# Patient Record
Sex: Female | Born: 1985 | Race: White | Hispanic: No | Marital: Married | Smoking: Never smoker
Health system: Southern US, Community
[De-identification: ages and names within clinical notes are randomized; demographics above are authoritative.]

## PROBLEM LIST (undated history)

## (undated) ENCOUNTER — Inpatient Hospital Stay (HOSPITAL_COMMUNITY): Payer: 59

## (undated) DIAGNOSIS — Z9109 Other allergy status, other than to drugs and biological substances: Secondary | ICD-10-CM

## (undated) DIAGNOSIS — R519 Headache, unspecified: Secondary | ICD-10-CM

## (undated) DIAGNOSIS — G8929 Other chronic pain: Secondary | ICD-10-CM

## (undated) DIAGNOSIS — M542 Cervicalgia: Secondary | ICD-10-CM

## (undated) DIAGNOSIS — R51 Headache: Secondary | ICD-10-CM

---
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
## 2007-01-03 ENCOUNTER — Emergency Department (HOSPITAL_COMMUNITY): Admission: EM | Admit: 2007-01-03 | Discharge: 2007-01-03 | Payer: Self-pay | Admitting: Emergency Medicine

## 2007-01-18 ENCOUNTER — Emergency Department (HOSPITAL_COMMUNITY): Admission: EM | Admit: 2007-01-18 | Discharge: 2007-01-19 | Payer: Self-pay | Admitting: Emergency Medicine

## 2007-08-29 ENCOUNTER — Encounter: Admission: RE | Admit: 2007-08-29 | Discharge: 2007-08-29 | Payer: Self-pay | Admitting: Obstetrics and Gynecology

## 2008-05-16 ENCOUNTER — Emergency Department (HOSPITAL_COMMUNITY): Admission: EM | Admit: 2008-05-16 | Discharge: 2008-05-16 | Payer: Self-pay | Admitting: Emergency Medicine

## 2009-12-22 ENCOUNTER — Emergency Department (HOSPITAL_COMMUNITY): Admission: EM | Admit: 2009-12-22 | Discharge: 2009-12-22 | Payer: Self-pay | Admitting: Emergency Medicine

## 2011-05-25 ENCOUNTER — Ambulatory Visit (INDEPENDENT_AMBULATORY_CARE_PROVIDER_SITE_OTHER): Payer: Commercial Managed Care - PPO | Admitting: Otolaryngology

## 2011-05-25 DIAGNOSIS — R490 Dysphonia: Secondary | ICD-10-CM

## 2011-09-07 LAB — BASIC METABOLIC PANEL
CO2: 24
Chloride: 107
Creatinine, Ser: 0.75
GFR calc Af Amer: >60
Potassium: 3.7

## 2011-09-07 LAB — DIFFERENTIAL
Basophils Relative: 1
Eosinophils Absolute: 0.1
Eosinophils Relative: 2
Monocytes Absolute: 0.4
Monocytes Relative: 7
Neutrophils Relative %: 50

## 2011-09-07 LAB — CBC
HCT: 36.8
Hemoglobin: 13.1
MCHC: 35.7
MCV: 86.2
RBC: 4.27

## 2011-10-30 ENCOUNTER — Other Ambulatory Visit (HOSPITAL_COMMUNITY): Payer: Self-pay | Admitting: Pulmonary Disease

## 2011-10-30 ENCOUNTER — Ambulatory Visit (HOSPITAL_COMMUNITY)
Admission: RE | Admit: 2011-10-30 | Discharge: 2011-10-30 | Disposition: A | Discharge status: Home / Self Care | Payer: 59 | Source: Ambulatory Visit | Attending: Pulmonary Disease | Admitting: Pulmonary Disease

## 2011-10-30 DIAGNOSIS — R05 Cough: Secondary | ICD-10-CM | POA: Insufficient documentation

## 2011-10-30 DIAGNOSIS — R079 Chest pain, unspecified: Secondary | ICD-10-CM | POA: Insufficient documentation

## 2011-10-30 DIAGNOSIS — R059 Cough, unspecified: Secondary | ICD-10-CM | POA: Insufficient documentation

## 2011-11-16 ENCOUNTER — Ambulatory Visit (INDEPENDENT_AMBULATORY_CARE_PROVIDER_SITE_OTHER): Payer: 59 | Admitting: Otolaryngology

## 2012-02-29 ENCOUNTER — Other Ambulatory Visit: Payer: Self-pay | Admitting: Obstetrics and Gynecology

## 2012-02-29 DIAGNOSIS — N63 Unspecified lump in unspecified breast: Secondary | ICD-10-CM

## 2012-03-04 ENCOUNTER — Ambulatory Visit
Admission: RE | Admit: 2012-03-04 | Discharge: 2012-03-04 | Disposition: A | Discharge status: Home / Self Care | Payer: Self-pay | Source: Ambulatory Visit | Attending: Obstetrics and Gynecology | Admitting: Obstetrics and Gynecology

## 2012-03-04 DIAGNOSIS — N63 Unspecified lump in unspecified breast: Secondary | ICD-10-CM

## 2015-04-11 ENCOUNTER — Emergency Department (HOSPITAL_COMMUNITY)
Admission: EM | Admit: 2015-04-11 | Discharge: 2015-04-11 | Disposition: A | Discharge status: Home / Self Care | Payer: 59 | Attending: Emergency Medicine | Admitting: Emergency Medicine

## 2015-04-11 ENCOUNTER — Encounter (HOSPITAL_COMMUNITY): Payer: Self-pay | Admitting: Emergency Medicine

## 2015-04-11 ENCOUNTER — Emergency Department (HOSPITAL_COMMUNITY): Payer: 59

## 2015-04-11 DIAGNOSIS — Z3202 Encounter for pregnancy test, result negative: Secondary | ICD-10-CM | POA: Insufficient documentation

## 2015-04-11 DIAGNOSIS — Z8739 Personal history of other diseases of the musculoskeletal system and connective tissue: Secondary | ICD-10-CM | POA: Diagnosis not present

## 2015-04-11 DIAGNOSIS — Z8709 Personal history of other diseases of the respiratory system: Secondary | ICD-10-CM | POA: Insufficient documentation

## 2015-04-11 DIAGNOSIS — G43809 Other migraine, not intractable, without status migrainosus: Secondary | ICD-10-CM | POA: Insufficient documentation

## 2015-04-11 DIAGNOSIS — G8929 Other chronic pain: Secondary | ICD-10-CM | POA: Insufficient documentation

## 2015-04-11 DIAGNOSIS — R51 Headache: Secondary | ICD-10-CM | POA: Diagnosis present

## 2015-04-11 HISTORY — DX: Other chronic pain: G89.29

## 2015-04-11 HISTORY — DX: Headache, unspecified: R51.9

## 2015-04-11 HISTORY — DX: Cervicalgia: M54.2

## 2015-04-11 HISTORY — DX: Other allergy status, other than to drugs and biological substances: Z91.09

## 2015-04-11 HISTORY — DX: Headache: R51

## 2015-04-11 LAB — URINALYSIS, ROUTINE W REFLEX MICROSCOPIC
Bilirubin Urine: NEGATIVE
GLUCOSE, UA: NEGATIVE mg/dL
Hgb urine dipstick: NEGATIVE
KETONES UR: NEGATIVE mg/dL
NITRITE: NEGATIVE
PH: 8 (ref 5.0–8.0)
Specific Gravity, Urine: 1.015 (ref 1.005–1.030)
Urobilinogen, UA: 0.2 mg/dL (ref 0.0–1.0)

## 2015-04-11 LAB — RAPID URINE DRUG SCREEN, HOSP PERFORMED
AMPHETAMINES: NOT DETECTED
Barbiturates: NOT DETECTED
Benzodiazepines: NOT DETECTED
Cocaine: NOT DETECTED
Opiates: NOT DETECTED
Tetrahydrocannabinol: NOT DETECTED

## 2015-04-11 LAB — I-STAT CHEM 8, ED
BUN: 10 mg/dL (ref 6–20)
CREATININE: 0.6 mg/dL (ref 0.44–1.00)
Calcium, Ion: 1.22 mmol/L (ref 1.12–1.23)
Chloride: 102 mmol/L (ref 101–111)
Glucose, Bld: 99 mg/dL (ref 70–99)
HCT: 42 % (ref 36.0–46.0)
Hemoglobin: 14.3 g/dL (ref 12.0–15.0)
POTASSIUM: 3.3 mmol/L — AB (ref 3.5–5.1)
SODIUM: 140 mmol/L (ref 135–145)
TCO2: 20 mmol/L (ref 0–100)

## 2015-04-11 LAB — URINE MICROSCOPIC-ADD ON

## 2015-04-11 LAB — PROTIME-INR
INR: 1.05 (ref 0.00–1.49)
PROTHROMBIN TIME: 13.8 s (ref 11.6–15.2)

## 2015-04-11 LAB — PREGNANCY, URINE: Preg Test, Ur: NEGATIVE

## 2015-04-11 MED ORDER — METOCLOPRAMIDE HCL 10 MG PO TABS: 10.0000 mg | ORAL_TABLET | Freq: Four times a day (QID) | ORAL | Status: DC | PRN

## 2015-04-11 MED ORDER — SODIUM CHLORIDE 0.9 % IV BOLUS (SEPSIS)
1000.0000 mL | Freq: Once | INTRAVENOUS | Status: AC
  Administered 2015-04-11: 1000 mL via INTRAVENOUS

## 2015-04-11 MED ORDER — ONDANSETRON HCL 4 MG/2ML IJ SOLN
4.0000 mg | Freq: Once | INTRAMUSCULAR | Status: AC
  Administered 2015-04-11: 4 mg via INTRAVENOUS

## 2015-04-11 MED ORDER — METOCLOPRAMIDE HCL 5 MG/ML IJ SOLN
10.0000 mg | Freq: Once | INTRAMUSCULAR | Status: AC
  Administered 2015-04-11: 10 mg via INTRAVENOUS
  Filled 2015-04-11: qty 2

## 2015-04-11 MED ORDER — DIPHENHYDRAMINE HCL 25 MG PO CAPS
50.0000 mg | ORAL_CAPSULE | Freq: Once | ORAL | Status: AC
  Administered 2015-04-11: 50 mg via ORAL
  Filled 2015-04-11: qty 2

## 2015-04-11 MED ORDER — KETOROLAC TROMETHAMINE 30 MG/ML IJ SOLN
30.0000 mg | Freq: Once | INTRAMUSCULAR | Status: AC
  Administered 2015-04-11: 30 mg via INTRAVENOUS
  Filled 2015-04-11: qty 1

## 2015-04-11 MED ORDER — ONDANSETRON HCL 4 MG/2ML IJ SOLN
INTRAMUSCULAR | Status: AC
  Filled 2015-04-11: qty 2

## 2015-04-11 NOTE — ED Notes (Signed)
Pt underoing telenurology consult at this time.

## 2015-04-11 NOTE — ED Notes (Signed)
MD at bedside. Talked with teleneurology and MD, agree to discontinue this as a code stroke.

## 2015-04-11 NOTE — ED Notes (Signed)
Pt made aware that we need a urine specimen. 
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
## 2015-04-11 NOTE — ED Notes (Addendum)
Called SOC at 14:00;SOC called back to talk to Dr.McManus at 14:05; paperwork faxed ; placed in room

## 2015-04-11 NOTE — Discharge Instructions (Signed)
°Emergency Department Resource Guide °1) Find a Doctor and Pay Out of Pocket °Although you won't have to find out who is covered by your insurance plan, it is a good idea to ask around and get recommendations. You will then need to call the office and see if the doctor you have chosen will accept you as a new patient and what types of options they offer for patients who are self-pay. Some doctors offer discounts or will set up payment plans for their patients who do not have insurance, but you will need to ask so you aren't surprised when you get to your appointment. ° °2) Contact Your Local Health Department °Not all health departments have doctors that can see patients for sick visits, but many do, so it is worth a call to see if yours does. If you don't know where your local health department is, you can check in your phone book. The CDC also has a tool to help you locate your state's health department, and many state websites also have listings of all of their local health departments. ° °3) Find a Walk-in Clinic °If your illness is not likely to be very severe or complicated, you may want to try a walk in clinic. These are popping up all over the country in pharmacies, drugstores, and shopping centers. They're usually staffed by nurse practitioners or physician assistants that have been trained to treat common illnesses and complaints. They're usually fairly quick and inexpensive. However, if you have serious medical issues or chronic medical problems, these are probably not your best option. ° °No Primary Care Doctor: °- Call [facility] at  832-8000 - they can help you locate a primary care doctor that  accepts your insurance, provides certain services, etc. °- Physician Referral Service- 1-800-533-3463 ° °Chronic Pain Problems: °Organization         Address  Phone   Notes  °[facility]  (336) 297-2271 Patients need to be referred by their primary care doctor.  ° °Medication  Assistance: °Organization         Address  Phone   Notes  °[place] [facility] [address] °[place], [place] [address] (336) 641-8030 --Must be a resident of [place] °-- Must have NO insurance coverage whatsoever (no Medicaid/ Medicare, etc.) °-- The pt. MUST have a primary care doctor that directs their care regularly and follows them in the community °  °[facility]  (866) 331-1348   °[employer]  (888) 892-1162   ° °Agencies that provide inexpensive medical care: °Organization         Address  Phone   Notes  °[facility]  (336) 832-8035   °[facility]    (336) 832-7272   °[facility] [address] °[place], [place] [address] (336) 832-4777   °[facility] [address], °[place] (336) 271-4999   °[facility]    (336) 373-0678   °[facility]    (336) 272-1050   °[facility] ° [address], [place] Phone:  (336) 832-4444, Fax:  (336) 832-4440 Hours of Operation:  9 am - 6 pm, M-F.  Also accepts Medicaid/Medicare and self-pay.  °[facility] ° [address], [place] Phone: (336) 832-3150, Fax: (336) [phone]. Hours of Operation:  8:30 am - 5:30 pm, M-F.  Also accepts Medicaid and self-pay.  °[facility] [place] [address], [place] Phone: (336) 878-6027   °[facility] [address], [place], [place] (336)723-1848, Ext. 123 Mondays & Thursdays: 7-9 AM.  First 15 patients are seen on a first come, first serve basis. °  ° °Medicaid-accepting [place] Providers: ° °Organization         Address  Phone   Notes  °[facility] [address], [place] (336) 641-2100 Also accepts self-pay patients.  °[facility] [address], [place] ° (336) 856-9996   °[facility] [address], [place]  (336) 288-8857   °[facility] [address] [place] [address], [place] (336) 299-7000   °Veita Bland [address], [place]  ° (336) 373-1557 Only accepts [place] Access Medicaid patients after they have their name applied to their card.  ° °Self-Pay (no insurance) in [place]: ° °Organization         Address  Phone   Notes  °Sickle Cell Patients, [facility] [address], [place] (336) 832-1970   °[facility] [address], [place] (336) 832-4400   °[facility] ° [address], [place] (336) 992-4800   °[facility]/Dr. Osei-Bonsu ° [address] [place] [address], [place] or [address], [place] (336) [phone] Phone number for both [place] and [place] locations is the same.  °[facility] [address], [place] (336) 299-0000   °[facility] [address] [place] [address], [place] or [address] (336) 852-7530 °(336) 878-2260   °[facility] [address], [place] (336) 350-1642, phone; (336) 294-5005, fax Sees patients 1st and 3rd Saturday of every month.  Must not qualify for public or private insurance (i.e. Medicaid, Medicare, [place] Health Choice, Veterans' Benefits) • Household income should be no more than 200% of the poverty level •The clinic cannot treat you if you are pregnant or think you are pregnant • Sexually transmitted diseases are not treated at the clinic.  ° ° °Dental Care: °Organization         Address  Phone  Notes  °[place] [facility] [address], [place] (336) 641-6152 Accepts children up to age 21 who are enrolled in Medicaid or [place] Health Choice; pregnant women with a Medicaid card; and children who have applied for Medicaid or [place] Health Choice, but were declined, whose parents can pay a reduced fee at time of service.  °[place]  [facility] [place]  [address], [place] (336) 641-7733 Accepts children up to age 21 who are enrolled in Medicaid or [place] Health Choice; pregnant women with a Medicaid card; and children who have applied for Medicaid or [place] Health Choice, but were declined, whose parents can pay a reduced fee at time of service.  °[facility] ° [address], [place] (336) 641-4533 Patients are seen by appointment only. Walk-ins are not accepted. [facility] will see patients 18 years of age and older. °Monday - Tuesday (8am-5pm) °Most Wednesdays (8:30-5pm) °$30 per visit, cash only  °[facility] ° [address], [place] (336) 641-4533 Patients are seen by appointment only. Walk-ins are not accepted. [facility] will see patients 18 years of age and older. °One   Wednesday Evening (Monthly: Volunteer Based).  $30 per visit, cash only  °[facility]  (919) 537-3737 for adults; Children under age 4, call Graduate Pediatric Dentistry at (919) [phone]. Children aged 4-14, please call (919) [phone] to request a pediatric application. ° Dental services are provided in all areas of dental care including fillings, crowns and bridges, complete and partial dentures, implants, gum treatment, root canals, and extractions. Preventive care is also provided. Treatment is provided to both adults and children. °Patients are selected via a lottery and there is often a waiting list. °  °[facility] [address], °[place] ° (336) 763-8833 [email or web address] °  °[facility] [address], [place], [place] (336)723-1848, Ext. 123 Second and Fourth Thursday of each month, opens at 6:30 AM; Clinic ends at 9 AM.  Patients are seen on a first-come first-served basis, and a limited number are seen during each clinic.  ° °[facility] ° [address], [place], [place] (336) 723-7904    Eligibility Requirements °You must have lived in [place], [place], or [place] counties for at least the last three months. °  You cannot be eligible for state or federal sponsored healthcare insurance, including Veterans Administration, Medicaid, or Medicare. °  You generally cannot be eligible for healthcare insurance through your employer.  °  How to apply: °Eligibility screenings are held every Tuesday and Wednesday afternoon from 1:00 pm until 4:00 pm. You do not need an appointment for the interview!  °[facility] [address], [place], [place] 336-631-2330   °[facility] [facility]  336-342-8273   °[facility]  336-703-3100   °[facility]  336-570-6415   ° °Behavioral Health Resources in the Community: °Intensive Outpatient Programs °Organization         Address  Phone  Notes  °[facility] [address], [place], [place] 336-878-6098   °[facility] [address], [place], [place] 336-832-9800   °[facility] [address], [place], [place] ° 336-882-2125   °[facility] [address],  °[place], [place] 1-800-853-5163 or 336-641-4981   °Substance Abuse Resources °Organization         Address  Phone  Notes  °[facility]  336-882-2125   °[facility]  336-784-9470   °[facility]  336-285-9073   °[facility]  336-845-3988   °Residential & Outpatient Substance Abuse Program  1-800-659-3381   °Psychological Services °Organization         Address  Phone  Notes  °[facility]  336- 832-9600   °[facility]  336- 378-7881   °[facility] [address], [place] 1-800-853-5163 or 336-641-4981   ° °Mobile Crisis Teams °Organization         Address  Phone  Notes  °[facility], Mobile Crisis Care Unit  1-877-626-1772   °Assertive °[facility] ° [address]  [place], [place] 336-834-9664   °Sharon DeEsch [address] °[place] 336-554-5454   ° °Self-Help/Support Groups °Organization         Address  Phone             Notes  °[facility]  - variety of support groups  336- 373-1402 Call for more information  °Narcotics Anonymous (NA), [facility] [address], °[place]  2 meetings at this location  ° °  Residential Treatment Programs °Organization         Address  Phone  Notes  °[facility] [address],    °[place]  1-866-801-8205   °[facility] ° [address], [place], [place] 704-293-8524   °[facility] [address], [place] 336-845-3988 Admissions: 8am-3pm M-F  °[facility] [address]    °[place], [place] 336-841-1104   °[facility] [address], [place], [place] 336-379-7146   °[facility] [address]  °[place], [place] 336-285-9073   °[facility] - Intensive Outpatient [address], [place], [place] 336-852-3033   °[facility] [address]  °[place], [place] 1-877-615-2722 or 336-784-9470   °[facility] ([facility] [address] [place], [place] 336-227-7417 Accepts Medicaid  °[facility] [address]  °[place] 1-800-659-3381 Substance Abuse/Addiction Treatment  ° °[place] Behavioral Health Resources °Organization         Address  Phone  Notes  °[facility]  (888) 581-9988   °Julie Brannon, PhD [address] [place], [place]   (336) 349-5553 or (336) 951-0000   °[facility]   [address] °[place], [place] (336) 349-4454   °[facility] [address], [place], [place] (336) 342-8316 Insurance/Medicaid/sponsorship through [facility]  °[facility] [address]                                    [place], [place] (336) 342-8316 Therapy/tele-psych/case    °[facility] [address]  ° [place], [place] (336) 349-2233    °Dr. Arfeen  (336) 349-4544   °[facility] [place]  [employer] [place] [facility] 1) [address], [place] °2) [address], [place] °3)  [address], [place] (336) 349-3220 °(336) 342-7768 ° °(336) 342-8140   °[place] [facility] (336) 342-1394 or (336) 342-3537 (After Hours)    ° ° °Take over the counter tylenol and ibuprofen (OR excedrin) and benadryl, as directed on packaging, with the prescription given to you today, as needed for headache.  Call your regular medical doctor tomorrow to schedule a follow up appointment within the next 3  days.  Return to the Emergency Department immediately sooner if worsening.  ° °

## 2015-04-11 NOTE — ED Notes (Signed)
Patient c/o sudden onset headache, dizziness, blurred vision, numbness in hands bilaterally, and confusion that started approx 11am. Per patient checked blood pressure 140/100 which patient states is highly unusal for her.

## 2015-04-12 LAB — I-STAT TROPONIN, ED: Troponin i, poc: 0 ng/mL (ref 0.00–0.08)

## 2015-12-14 IMAGING — CT CT HEAD W/O CM
5 of 6 series · 15 of 47 positions shown, 16 images · non-contrast
Comparison: None.

CLINICAL DATA: MID FOREHEAD HEADACHE, BILAT HAND NUMBNESS, BILAT
NECK STIFFNESS/TIGHT SINCE 11AM TODAY

EXAM:
CT HEAD WITHOUT CONTRAST
CT CERVICAL SPINE WITHOUT CONTRAST
TECHNIQUE: Multidetector CT imaging of the head and cervical spine was
performed following the standard protocol without intravenous
contrast. Multiplanar CT image reconstructions of the cervical spine
were also generated.

[Series 2: headseq 4.8 h37s · axial · 0.47mm/px · z∈[+279,+327]mm · 2 of 30 slices shown]
[im 10/30  brain]
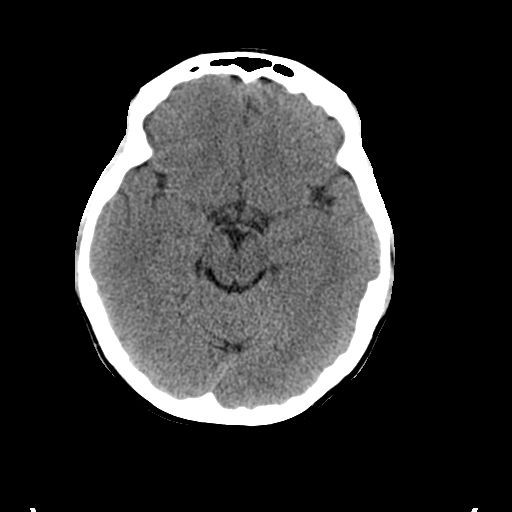
[im 20/30  brain]
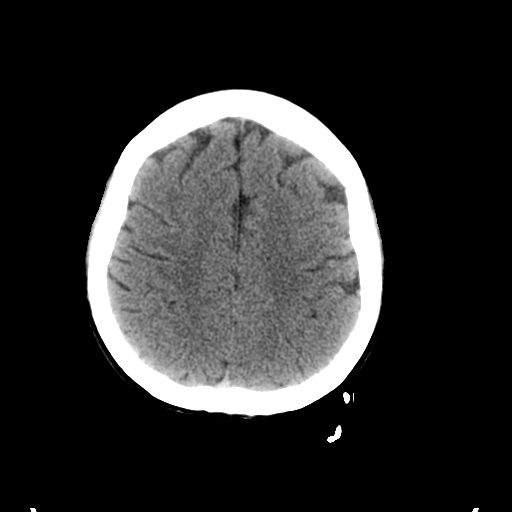

[Series 3: headseq 2.4 h60s · axial · 0.47mm/px · z∈[+261,+348]mm · 4 of 60 slices shown, 5 images]
[im 12/60  brain]
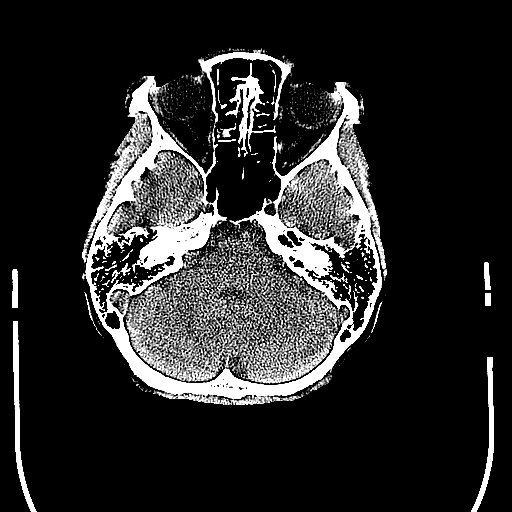
[im 12/60  bone]
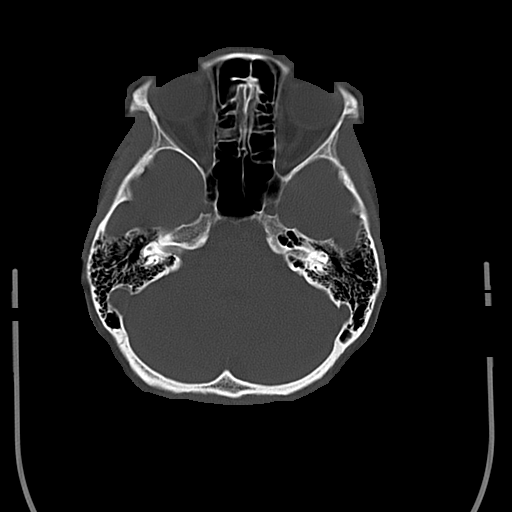
[im 24/60  brain]
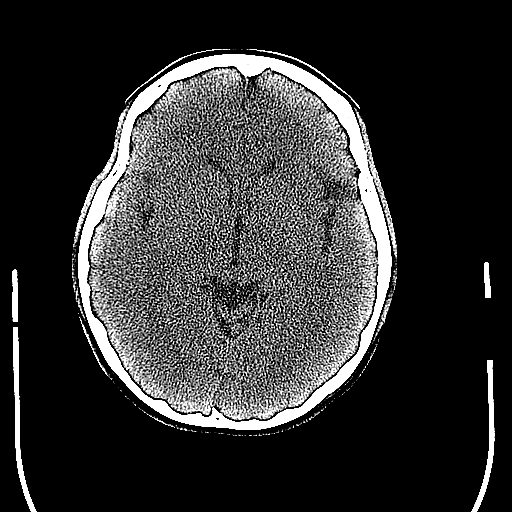
[im 36/60  brain]
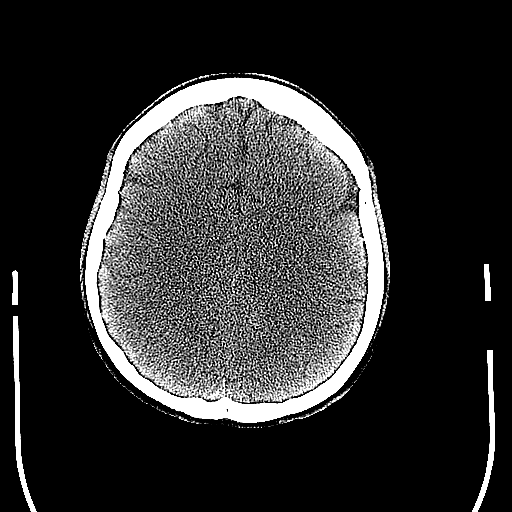
[im 48/60  brain]
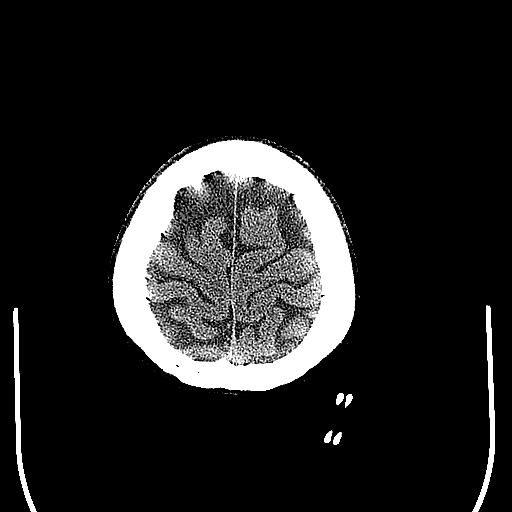

[Series 7: sagittal bone 2.0 · sagittal · 0.29mm/px · 3 of 60 slices shown]
[im 20/60  brain]
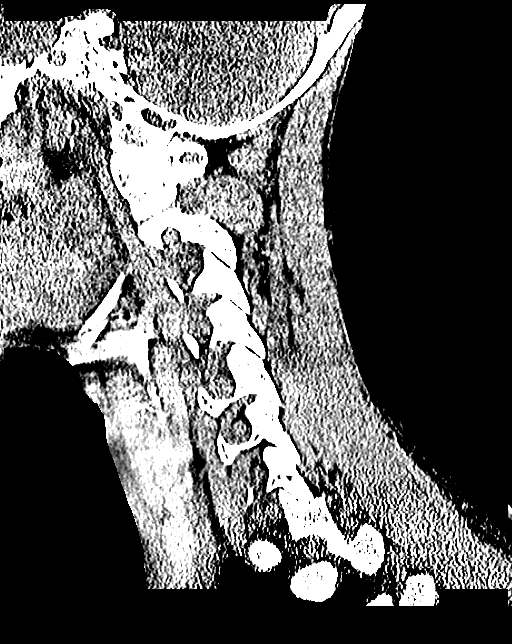
[im 30/60  brain]
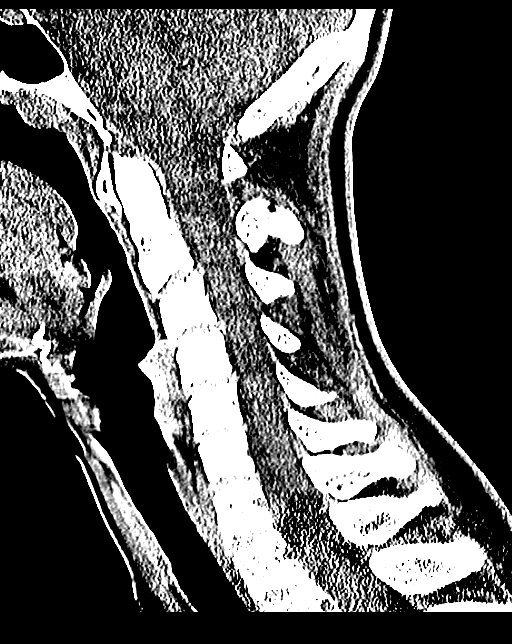
[im 40/60  brain]
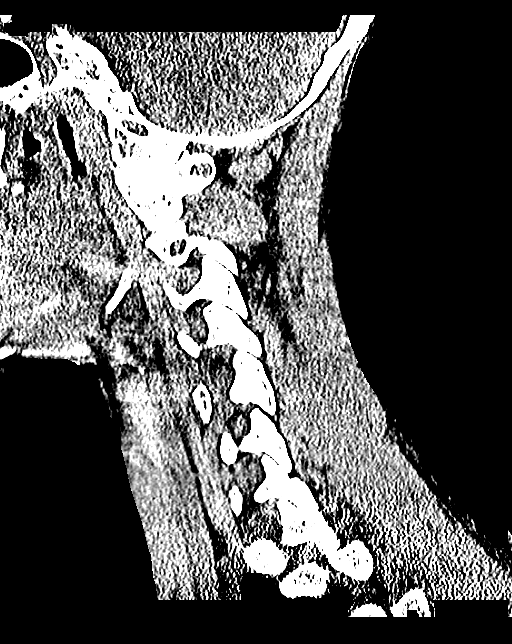

[Series 8: coronal bone 2.0 · coronal · 0.21mm/px · 3 of 54 slices shown]
[im 18/54  brain]
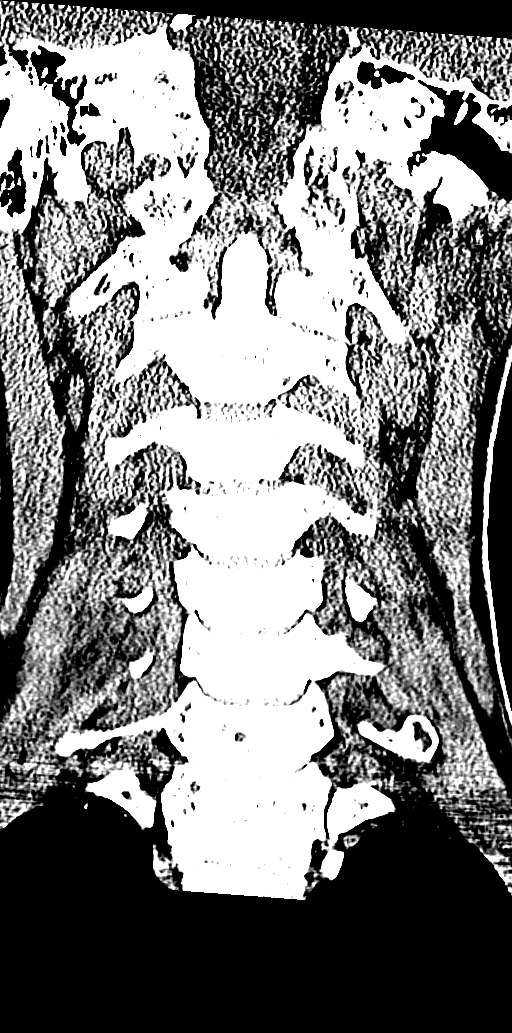
[im 24/54  brain]
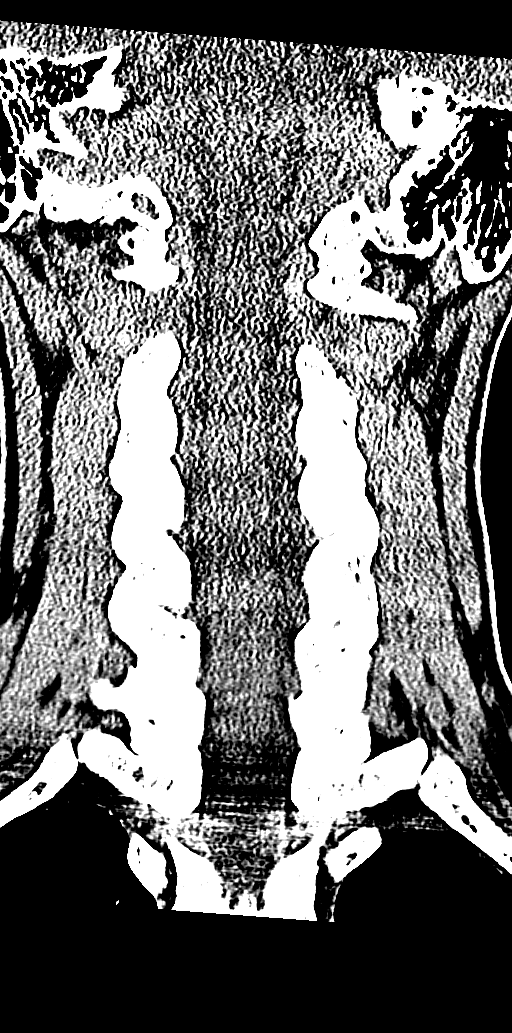
[im 30/54  brain]
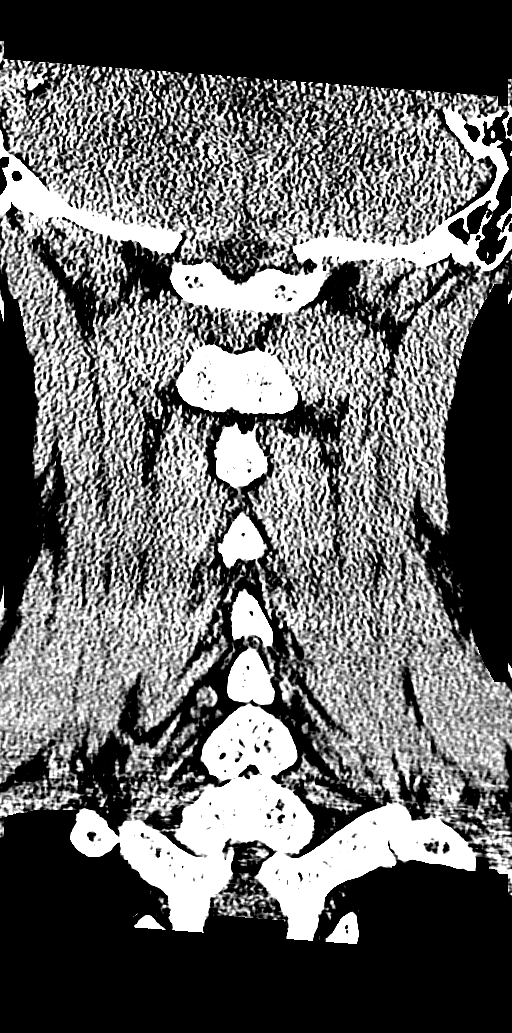

[Series 9: axial bone 2.0 · axial · 0.22mm/px · z∈[+68,+109]mm · 3 of 110 slices shown]
[im 11/110  bone]
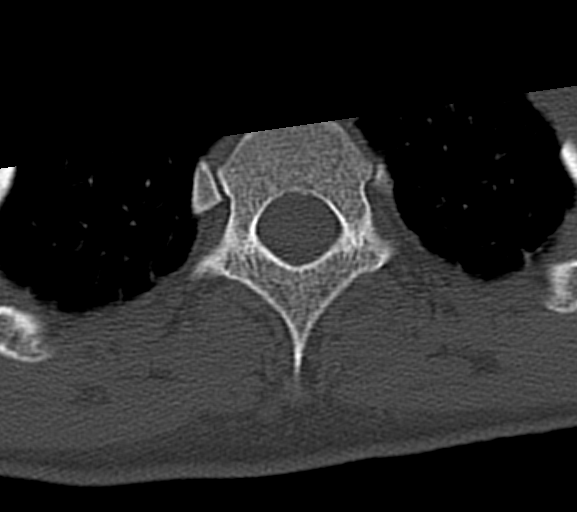
[im 22/110  bone]
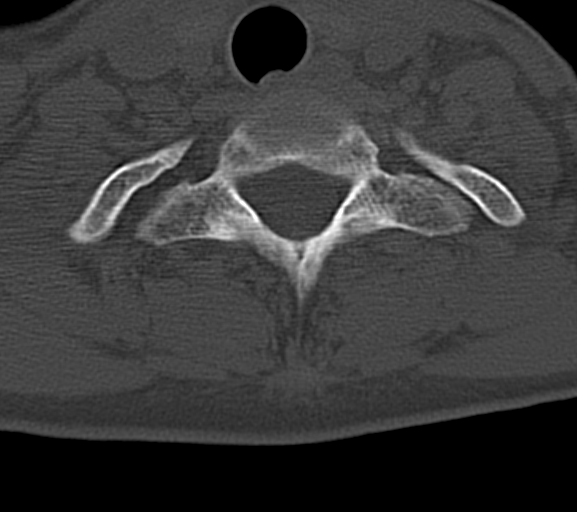
[im 33/110  bone]
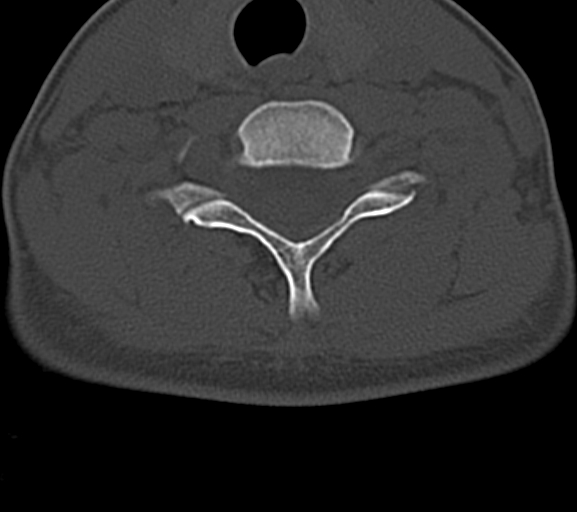

[15 of 47 positions shown; findings below may reference images not displayed]

FINDINGS: CT HEAD FINDINGS

No mass lesion. No midline shift. No acute hemorrhage or hematoma.
No extra-axial fluid collections. No evidence of acute infarction.
There is no skull fracture.

CT CERVICAL SPINE FINDINGS

Reversed lordosis. No fracture or degenerative change. No soft
tissue abnormalities.
IMPRESSION: Negative CT of the head and cervical spine. Critical Value/emergent
results were called by telephone at the time of interpretation on
04/11/2015 at [DATE] to Dr. NAZARETH JUMPER , who verbally
acknowledged these results.

## 2019-03-06 ENCOUNTER — Telehealth: Payer: 59 | Admitting: Physician Assistant

## 2019-03-06 DIAGNOSIS — Z209 Contact with and (suspected) exposure to unspecified communicable disease: Secondary | ICD-10-CM

## 2019-03-06 DIAGNOSIS — J069 Acute upper respiratory infection, unspecified: Secondary | ICD-10-CM

## 2019-03-06 NOTE — Progress Notes (Signed)
I have spent 5 minutes in review of e-visit questionnaire, review and updating patient chart, medical decision making and response to patient.   Wanita Derenzo Cody Shaunta Oncale, PA-C    

## 2019-03-06 NOTE — Progress Notes (Signed)
Message sent to patient for further information. Awaiting response.   Piedad Climes, PA-C

## 2019-03-06 NOTE — Progress Notes (Signed)
E-Visit for [employer] Virus Screening  Based on what you have told me there is a possibility of exposure to coronavirus due to the people you have come around but this could also be another one of many viruses going around. Since symptoms are mild I would watch them very closely. They are not doing routine testing currently for mild symptoms and people without exposure to a symptomatic person as it does not change management. Keep hydrated and you can utilize the OTC medications approved by your OB. I would keep at home. Since we are not testing via e-visit currently (no drive-through tent at the hospitals now) I would recommend that you stay home. The rule is if this were a mild case of coronavirus you would need to be quarantined for at least 7 days from start of symptoms and having no fever for 72 hours (without medicine) before returning to work.   I would recommend you call your OB again to let him know you did an e-visit and were told it is a possibility but symptoms are mild to see if they want to order the testing for you and to give you further input for monitoring your little one.   I do feel you will be fine based on what you've told me so far but if you note any acute worsening of symptoms, I would want you to go to the ER.   Coronavirus disease 2019 (COVID-19) is a respiratory illness that can spread from person to person. The virus that causes COVID-19 is a new virus that was first identified in the country of [place] but is now found in multiple other countries and has spread to the [place].  Symptoms associated with the virus are mild to severe fever, cough, and shortness of breath. There is currently no vaccine to protect against COVID-19, and there is no specific antiviral treatment for the virus.   To be considered HIGH RISK for Coronavirus (COVID-19), you have to meet the following criteria:  . Traveled to [place], [place], [place], [place] or [place]; or in the [place] to [place],  [place], [place], or [place]; and have fever, cough, and shortness of breath within the last 2 weeks of travel OR  . Been in close contact with a person diagnosed with COVID-19 within the last 2 weeks and have fever, cough, and shortness of breath  . IF YOU DO NOT MEET THESE CRITERIA, YOU ARE CONSIDERED LOW RISK FOR COVID-19.   It is vitally important that if you feel that you have an infection such as this virus or any other virus that you stay home and away from places where you may spread it to others.  You should self-quarantine for 14 days if you have symptoms that could potentially be coronavirus and avoid contact with people age 49 and older.   You may also take acetaminophen (Tylenol) as needed for fever.   Reduce your risk of any infection by using the same precautions used for avoiding the common cold or flu:  Marland Kitchen Wash your hands often with soap and warm water for at least 20 seconds.  If soap and water are not readily available, use an alcohol-based hand sanitizer with at least 60% alcohol.  . If coughing or sneezing, cover your mouth and nose by coughing or sneezing into the elbow areas of your shirt or coat, into a tissue or into your sleeve (not your hands). . Avoid shaking hands with others and consider head nods  or verbal greetings only. . Avoid touching your eyes, nose, or mouth with unwashed hands.  . Avoid close contact with people who are sick. . Avoid places or events with large numbers of people in one location, like concerts or sporting events. . Carefully consider travel plans you have or are making. . If you are planning any travel outside or inside the [place], visit the CDC's Travelers' Health webpage for the latest health notices. . If you have some symptoms but not all symptoms, continue to monitor at home and seek medical attention if your symptoms worsen. . If you are having a medical emergency, call 911.  HOME CARE . Only take medications as instructed by  your medical team. . Drink plenty of fluids and get plenty of rest. . A steam or ultrasonic humidifier can help if you have congestion.   GET HELP RIGHT AWAY IF: . You develop worsening fever. . You become short of breath . You cough up blood. . Your symptoms become more severe MAKE SURE YOU   Understand these instructions.  Will watch your condition.  Will get help right away if you are not doing well or get worse.  Your e-visit answers were reviewed by a board certified advanced clinical practitioner to complete your personal care plan.  Depending on the condition, your plan could have included both over the counter or prescription medications.  If there is a problem please reply once you have received a response from your provider. Your safety is important to [place].  If you have drug allergies check your prescription carefully.    You can use MyChart to ask questions about today's visit, request a non-urgent call back, or ask for a work or school excuse for 24 hours related to this e-Visit. If it has been greater than 24 hours you will need to follow up with your provider, or enter a new e-Visit to address those concerns. You will get an e-mail in the next two days asking about your experience.  I hope that your e-visit has been valuable and will speed your recovery. Thank you for using e-visits.

## 2019-07-24 ENCOUNTER — Encounter (HOSPITAL_COMMUNITY): Payer: Self-pay

## 2019-07-24 ENCOUNTER — Inpatient Hospital Stay (HOSPITAL_COMMUNITY)
Admission: AD | Admit: 2019-07-24 | Discharge: 2019-07-24 | Disposition: A | Discharge status: Home / Self Care | Payer: 59 | Attending: Obstetrics and Gynecology | Admitting: Obstetrics and Gynecology

## 2019-07-24 ENCOUNTER — Other Ambulatory Visit: Payer: Self-pay

## 2019-07-24 DIAGNOSIS — Z3A30 30 weeks gestation of pregnancy: Secondary | ICD-10-CM

## 2019-07-24 DIAGNOSIS — N949 Unspecified condition associated with female genital organs and menstrual cycle: Secondary | ICD-10-CM

## 2019-07-24 DIAGNOSIS — O26893 Other specified pregnancy related conditions, third trimester: Secondary | ICD-10-CM

## 2019-07-24 DIAGNOSIS — R109 Unspecified abdominal pain: Secondary | ICD-10-CM | POA: Diagnosis present

## 2019-07-24 LAB — URINALYSIS, ROUTINE W REFLEX MICROSCOPIC
Bilirubin Urine: NEGATIVE
Glucose, UA: NEGATIVE mg/dL
Hgb urine dipstick: NEGATIVE
Ketones, ur: NEGATIVE mg/dL
Leukocytes,Ua: NEGATIVE
Nitrite: NEGATIVE
Protein, ur: NEGATIVE mg/dL
Specific Gravity, Urine: 1.009 (ref 1.005–1.030)
pH: 8 (ref 5.0–8.0)

## 2019-07-24 MED ORDER — ACETAMINOPHEN 500 MG PO TABS
1000.0000 mg | ORAL_TABLET | Freq: Once | ORAL | Status: AC
  Administered 2019-07-24: 1000 mg via ORAL
  Filled 2019-07-24: qty 2

## 2019-07-24 NOTE — Discharge Instructions (Signed)
Fetal Movement Counts °Patient Name: ________________________________________________ Patient Due Date: ____________________ °What is a fetal movement count? ° °A fetal movement count is the number of times that you feel your baby move during a certain amount of time. This may also be called a fetal kick count. A fetal movement count is recommended for every pregnant woman. You may be asked to start counting fetal movements as early as week 28 of your pregnancy. °Pay attention to when your baby is most active. You may notice your baby's sleep and wake cycles. You may also notice things that make your baby move more. You should do a fetal movement count: °· When your baby is normally most active. °· At the same time each day. °A good time to count movements is while you are resting, after having something to eat and drink. °How do I count fetal movements? °1. Find a quiet, comfortable area. Sit, or lie down on your side. °2. Write down the date, the start time and stop time, and the number of movements that you felt between those two times. Take this information with you to your health care visits. °3. For 2 hours, count kicks, flutters, swishes, rolls, and jabs. You should feel at least 10 movements during 2 hours. °4. You may stop counting after you have felt 10 movements. °5. If you do not feel 10 movements in 2 hours, have something to eat and drink. Then, keep resting and counting for 1 hour. If you feel at least 4 movements during that hour, you may stop counting. °Contact a health care provider if: °· You feel fewer than 4 movements in 2 hours. °· Your baby is not moving like he or she usually does. °Date: ____________ Start time: ____________ Stop time: ____________ Movements: ____________ °Date: ____________ Start time: ____________ Stop time: ____________ Movements: ____________ °Date: ____________ Start time: ____________ Stop time: ____________ Movements: ____________ °Date: ____________ Start time:  ____________ Stop time: ____________ Movements: ____________ °Date: ____________ Start time: ____________ Stop time: ____________ Movements: ____________ °Date: ____________ Start time: ____________ Stop time: ____________ Movements: ____________ °Date: ____________ Start time: ____________ Stop time: ____________ Movements: ____________ °Date: ____________ Start time: ____________ Stop time: ____________ Movements: ____________ °Date: ____________ Start time: ____________ Stop time: ____________ Movements: ____________ °This information is not intended to replace advice given to you by your health care provider. Make sure you discuss any questions you have with your health care provider. °Document Released: 12/27/2006 Document Revised: 12/17/2018 Document Reviewed: 01/06/2016 °Elsevier Patient Education © 2020 Elsevier Inc. °Braxton Hicks Contractions °Contractions of the uterus can occur throughout pregnancy, but they are not always a sign that you are in labor. You may have practice contractions called Braxton Hicks contractions. These false labor contractions are sometimes confused with true labor. °What are Braxton Hicks contractions? °Braxton Hicks contractions are tightening movements that occur in the muscles of the uterus before labor. Unlike true labor contractions, these contractions do not result in opening (dilation) and thinning of the cervix. Toward the end of pregnancy (32-34 weeks), Braxton Hicks contractions can happen more often and may become stronger. These contractions are sometimes difficult to tell apart from true labor because they can be very uncomfortable. You should not feel embarrassed if you go to the hospital with false labor. °Sometimes, the only way to tell if you are in true labor is for your health care provider to look for changes in the cervix. The health care provider will do a physical exam and may monitor your contractions. If you   are not in true labor, the exam should show  that your cervix is not dilating and your water has not broken. If there are no other health problems associated with your pregnancy, it is completely safe for you to be sent home with false labor. You may continue to have Braxton Hicks contractions until you go into true labor. How to tell the difference between true labor and false labor True labor  Contractions last 30-70 seconds.  Contractions become very regular.  Discomfort is usually felt in the top of the uterus, and it spreads to the lower abdomen and low back.  Contractions do not go away with walking.  Contractions usually become more intense and increase in frequency.  The cervix dilates and gets thinner. False labor  Contractions are usually shorter and not as strong as true labor contractions.  Contractions are usually irregular.  Contractions are often felt in the front of the lower abdomen and in the groin.  Contractions may go away when you walk around or change positions while lying down.  Contractions get weaker and are shorter-lasting as time goes on.  The cervix usually does not dilate or become thin. Follow these instructions at home:   Take over-the-counter and prescription medicines only as told by your health care provider.  Keep up with your usual exercises and follow other instructions from your health care provider.  Eat and drink lightly if you think you are going into labor.  If Braxton Hicks contractions are making you uncomfortable: ? Change your position from lying down or resting to walking, or change from walking to resting. ? Sit and rest in a tub of warm water. ? Drink enough fluid to keep your urine pale yellow. Dehydration may cause these contractions. ? Do slow and deep breathing several times an hour.  Keep all follow-up prenatal visits as told by your health care provider. This is important. Contact a health care provider if:  You have a fever.  You have continuous pain in  your abdomen. Get help right away if:  Your contractions become stronger, more regular, and closer together.  You have fluid leaking or gushing from your vagina.  You pass blood-tinged mucus (bloody show).  You have bleeding from your vagina.  You have low back pain that you never had before.  You feel your babys head pushing down and causing pelvic pressure.  Your baby is not moving inside you as much as it used to. Summary  Contractions that occur before labor are called Braxton Hicks contractions, false labor, or practice contractions.  Braxton Hicks contractions are usually shorter, weaker, farther apart, and less regular than true labor contractions. True labor contractions usually become progressively stronger and regular, and they become more frequent.  Manage discomfort from [facility] contractions by changing position, resting in a warm bath, drinking plenty of water, or practicing deep breathing. This information is not intended to replace advice given to you by your health care provider. Make sure you discuss any questions you have with your health care provider. Document Released: 04/12/2017 Document Revised: 11/09/2017 Document Reviewed: 04/12/2017 Elsevier Patient Education  [place]. Round Ligament Pain  The round ligament is a cord of muscle and tissue that helps support the uterus. It can become a source of pain during pregnancy if it becomes stretched or twisted as the baby grows. The pain usually begins in the second trimester (13-28 weeks) of pregnancy, and it can come and go until the baby is delivered.  It is not a serious problem, and it does not cause harm to the baby. Round ligament pain is usually a short, sharp, and pinching pain, but it can also be a dull, lingering, and aching pain. The pain is felt in the lower side of the abdomen or in the groin. It usually starts deep in the groin and moves up to the outside of the hip area. The pain may occur  when you:  Suddenly change position, such as quickly going from a sitting to standing position.  Roll over in bed.  Cough or sneeze.  Do physical activity. Follow these instructions at home:   Watch your condition for any changes.  When the pain starts, relax. Then try any of these methods to help with the pain: ? Sitting down. ? Flexing your knees up to your abdomen. ? Lying on your side with one pillow under your abdomen and another pillow between your legs. ? Sitting in a warm bath for 15-20 minutes or until the pain goes away.  Take over-the-counter and prescription medicines only as told by your health care provider.  Move slowly when you sit down or stand up.  Avoid long walks if they cause pain.  Stop or reduce your physical activities if they cause pain.  Keep all follow-up visits as told by your health care provider. This is important. Contact a health care provider if:  Your pain does not go away with treatment.  You feel pain in your back that you did not have before.  Your medicine is not helping. Get help right away if:  You have a fever or chills.  You develop uterine contractions.  You have vaginal bleeding.  You have nausea or vomiting.  You have diarrhea.  You have pain when you urinate. Summary  Round ligament pain is felt in the lower abdomen or groin. It is usually a short, sharp, and pinching pain. It can also be a dull, lingering, and aching pain.  This pain usually begins in the second trimester (13-28 weeks). It occurs because the uterus is stretching with the growing baby, and it is not harmful to the baby.  You may notice the pain when you suddenly change position, when you cough or sneeze, or during physical activity.  Relaxing, flexing your knees to your abdomen, lying on one side, or taking a warm bath may help to get rid of the pain.  Get help from your health care provider if the pain does not go away or if you have vaginal  bleeding, nausea, vomiting, diarrhea, or painful urination. This information is not intended to replace advice given to you by your health care provider. Make sure you discuss any questions you have with your health care provider. Document Released: 09/05/2008 Document Revised: 05/15/2018 Document Reviewed: 05/15/2018 Elsevier Patient Education  2020 Elsevier Inc.  
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
## 2019-07-24 NOTE — MAU Note (Signed)
Pt states that she started having cramping at 1115 and is still going on now.   Denies vaginal bleeding or LOF.   Reports +fm

## 2019-07-31 ENCOUNTER — Other Ambulatory Visit: Payer: Self-pay

## 2019-07-31 DIAGNOSIS — Z20822 Contact with and (suspected) exposure to covid-19: Secondary | ICD-10-CM

## 2019-08-02 LAB — NOVEL CORONAVIRUS, NAA: SARS-CoV-2, NAA: NOT DETECTED

## 2019-09-14 ENCOUNTER — Encounter (HOSPITAL_COMMUNITY): Payer: Self-pay

## 2019-09-14 ENCOUNTER — Inpatient Hospital Stay (HOSPITAL_COMMUNITY)
Admission: AD | Admit: 2019-09-14 | Discharge: 2019-09-16 | DRG: 787 | Disposition: A | Discharge status: Home / Self Care | Payer: 59 | Attending: Obstetrics | Admitting: Obstetrics

## 2019-09-14 ENCOUNTER — Inpatient Hospital Stay (HOSPITAL_COMMUNITY): Payer: 59 | Admitting: Certified Registered Nurse Anesthetist

## 2019-09-14 ENCOUNTER — Encounter (HOSPITAL_COMMUNITY)
Admission: AD | Disposition: A | Discharge status: Home / Self Care | Payer: Self-pay | Source: Home / Self Care | Attending: Obstetrics

## 2019-09-14 ENCOUNTER — Other Ambulatory Visit: Payer: Self-pay

## 2019-09-14 DIAGNOSIS — O9832 Other infections with a predominantly sexual mode of transmission complicating childbirth: Secondary | ICD-10-CM | POA: Diagnosis present

## 2019-09-14 DIAGNOSIS — O288 Other abnormal findings on antenatal screening of mother: Secondary | ICD-10-CM | POA: Diagnosis not present

## 2019-09-14 DIAGNOSIS — A6000 Herpesviral infection of urogenital system, unspecified: Secondary | ICD-10-CM | POA: Diagnosis present

## 2019-09-14 DIAGNOSIS — O133 Gestational [pregnancy-induced] hypertension without significant proteinuria, third trimester: Secondary | ICD-10-CM | POA: Diagnosis present

## 2019-09-14 DIAGNOSIS — O134 Gestational [pregnancy-induced] hypertension without significant proteinuria, complicating childbirth: Principal | ICD-10-CM | POA: Diagnosis present

## 2019-09-14 DIAGNOSIS — Z3A38 38 weeks gestation of pregnancy: Secondary | ICD-10-CM | POA: Diagnosis not present

## 2019-09-14 DIAGNOSIS — Z20828 Contact with and (suspected) exposure to other viral communicable diseases: Secondary | ICD-10-CM | POA: Diagnosis present

## 2019-09-14 DIAGNOSIS — O26893 Other specified pregnancy related conditions, third trimester: Secondary | ICD-10-CM | POA: Diagnosis present

## 2019-09-14 LAB — SARS CORONAVIRUS 2 BY RT PCR (HOSPITAL ORDER, PERFORMED IN ~~LOC~~ HOSPITAL LAB): SARS Coronavirus 2: NEGATIVE

## 2019-09-14 LAB — COMPREHENSIVE METABOLIC PANEL
ALT: 13 U/L (ref 0–44)
AST: 22 U/L (ref 15–41)
Albumin: 2.8 g/dL — ABNORMAL LOW (ref 3.5–5.0)
Alkaline Phosphatase: 306 U/L — ABNORMAL HIGH (ref 38–126)
Anion gap: 12 (ref 5–15)
BUN: 8 mg/dL (ref 6–20)
CO2: 18 mmol/L — ABNORMAL LOW (ref 22–32)
Calcium: 9.3 mg/dL (ref 8.9–10.3)
Chloride: 107 mmol/L (ref 98–111)
Creatinine, Ser: 0.59 mg/dL (ref 0.44–1.00)
GFR calc Af Amer: >60 mL/min (ref >60)
GFR calc non Af Amer: >60 mL/min (ref >60)
Glucose, Bld: 103 mg/dL — ABNORMAL HIGH (ref 70–99)
Potassium: 4 mmol/L (ref 3.5–5.1)
Sodium: 137 mmol/L (ref 135–145)
Total Bilirubin: 0.5 mg/dL (ref 0.3–1.2)
Total Protein: 6.8 g/dL (ref 6.5–8.1)

## 2019-09-14 LAB — URINALYSIS, ROUTINE W REFLEX MICROSCOPIC
Bilirubin Urine: NEGATIVE
Glucose, UA: NEGATIVE mg/dL
Ketones, ur: NEGATIVE mg/dL
Leukocytes,Ua: NEGATIVE
Nitrite: NEGATIVE
Protein, ur: NEGATIVE mg/dL
Specific Gravity, Urine: 1.006 (ref 1.005–1.030)
pH: 7 (ref 5.0–8.0)

## 2019-09-14 LAB — RPR: RPR Ser Ql: NONREACTIVE

## 2019-09-14 LAB — CBC
HCT: 36.5 % (ref 36.0–46.0)
Hemoglobin: 12.3 g/dL (ref 12.0–15.0)
MCH: 28.7 pg (ref 26.0–34.0)
MCHC: 33.7 g/dL (ref 30.0–36.0)
MCV: 85.3 fL (ref 80.0–100.0)
Platelets: 217 10*3/uL (ref 150–400)
RBC: 4.28 MIL/uL (ref 3.87–5.11)
RDW: 13.3 % (ref 11.5–15.5)
WBC: 11.1 10*3/uL — ABNORMAL HIGH (ref 4.0–10.5)
nRBC: 0 % (ref 0.0–0.2)

## 2019-09-14 LAB — PROTEIN / CREATININE RATIO, URINE
Creatinine, Urine: 32.99 mg/dL
Total Protein, Urine: <6 mg/dL

## 2019-09-14 SURGERY — Surgical Case
Anesthesia: Spinal

## 2019-09-14 MED ORDER — DEXAMETHASONE SODIUM PHOSPHATE 4 MG/ML IJ SOLN
INTRAMUSCULAR | Status: DC | PRN
  Administered 2019-09-14: 4 mg via INTRAVENOUS

## 2019-09-14 MED ORDER — MEPERIDINE HCL 25 MG/ML IJ SOLN: 6.2500 mg | INTRAMUSCULAR | Status: DC | PRN

## 2019-09-14 MED ORDER — OXYCODONE HCL 5 MG PO TABS: 5.0000 mg | ORAL_TABLET | ORAL | Status: DC | PRN

## 2019-09-14 MED ORDER — KETOROLAC TROMETHAMINE 30 MG/ML IJ SOLN: 30.0000 mg | Freq: Four times a day (QID) | INTRAMUSCULAR | Status: AC | PRN

## 2019-09-14 MED ORDER — PROMETHAZINE HCL 25 MG/ML IJ SOLN: 6.2500 mg | INTRAMUSCULAR | Status: DC | PRN

## 2019-09-14 MED ORDER — KETOROLAC TROMETHAMINE 30 MG/ML IJ SOLN
INTRAMUSCULAR | Status: AC
  Filled 2019-09-14: qty 1

## 2019-09-14 MED ORDER — SODIUM CHLORIDE 0.9 % IV SOLN
INTRAVENOUS | Status: DC | PRN
  Administered 2019-09-14: 05:00:00 40 [IU] via INTRAVENOUS

## 2019-09-14 MED ORDER — DEXAMETHASONE SODIUM PHOSPHATE 4 MG/ML IJ SOLN
INTRAMUSCULAR | Status: AC
  Filled 2019-09-14: qty 1

## 2019-09-14 MED ORDER — KETOROLAC TROMETHAMINE 30 MG/ML IJ SOLN
30.0000 mg | Freq: Four times a day (QID) | INTRAMUSCULAR | Status: AC | PRN
  Administered 2019-09-14 (×3): 30 mg via INTRAVENOUS
  Filled 2019-09-14 (×2): qty 1

## 2019-09-14 MED ORDER — OXYCODONE-ACETAMINOPHEN 5-325 MG PO TABS: 2.0000 | ORAL_TABLET | ORAL | Status: DC | PRN

## 2019-09-14 MED ORDER — ONDANSETRON HCL 4 MG/2ML IJ SOLN: 4.0000 mg | Freq: Four times a day (QID) | INTRAMUSCULAR | Status: DC | PRN

## 2019-09-14 MED ORDER — LACTATED RINGERS IV SOLN: 500.0000 mL | INTRAVENOUS | Status: DC | PRN

## 2019-09-14 MED ORDER — IBUPROFEN 600 MG PO TABS
600.0000 mg | ORAL_TABLET | Freq: Four times a day (QID) | ORAL | Status: DC
  Administered 2019-09-15 – 2019-09-16 (×7): 600 mg via ORAL
  Filled 2019-09-14 (×7): qty 1

## 2019-09-14 MED ORDER — NALBUPHINE SYRINGE 5 MG/0.5 ML
5.0000 mg | INJECTION | Freq: Once | INTRAMUSCULAR | Status: DC | PRN
  Filled 2019-09-14: qty 0.5

## 2019-09-14 MED ORDER — MORPHINE SULFATE (PF) 0.5 MG/ML IJ SOLN
INTRAMUSCULAR | Status: AC
  Filled 2019-09-14: qty 10

## 2019-09-14 MED ORDER — ONDANSETRON HCL 4 MG/2ML IJ SOLN: 4.0000 mg | INTRAMUSCULAR | Status: DC | PRN

## 2019-09-14 MED ORDER — SODIUM CHLORIDE 0.9% FLUSH: 3.0000 mL | INTRAVENOUS | Status: DC | PRN

## 2019-09-14 MED ORDER — SCOPOLAMINE 1 MG/3DAYS TD PT72
1.0000 | MEDICATED_PATCH | Freq: Once | TRANSDERMAL | Status: DC
  Filled 2019-09-14: qty 1

## 2019-09-14 MED ORDER — DIBUCAINE (PERIANAL) 1 % EX OINT: 1.0000 "application " | TOPICAL_OINTMENT | CUTANEOUS | Status: DC | PRN

## 2019-09-14 MED ORDER — BENZOCAINE-MENTHOL 20-0.5 % EX AERO: 1.0000 "application " | INHALATION_SPRAY | CUTANEOUS | Status: DC | PRN

## 2019-09-14 MED ORDER — OXYCODONE-ACETAMINOPHEN 5-325 MG PO TABS: 1.0000 | ORAL_TABLET | ORAL | Status: DC | PRN

## 2019-09-14 MED ORDER — HYDROMORPHONE HCL 1 MG/ML IJ SOLN: 0.2500 mg | INTRAMUSCULAR | Status: DC | PRN

## 2019-09-14 MED ORDER — CEFAZOLIN SODIUM-DEXTROSE 2-4 GM/100ML-% IV SOLN
2.0000 g | Freq: Once | INTRAVENOUS | Status: AC
  Administered 2019-09-14: 05:00:00 2 g via INTRAVENOUS

## 2019-09-14 MED ORDER — ACETAMINOPHEN 325 MG PO TABS: 650.0000 mg | ORAL_TABLET | ORAL | Status: DC | PRN

## 2019-09-14 MED ORDER — PRENATAL MULTIVITAMIN CH
1.0000 | ORAL_TABLET | Freq: Every day | ORAL | Status: DC
  Administered 2019-09-15 – 2019-09-16 (×2): 1 via ORAL
  Filled 2019-09-14 (×2): qty 1

## 2019-09-14 MED ORDER — COCONUT OIL OIL: 1.0000 "application " | TOPICAL_OIL | Status: DC | PRN

## 2019-09-14 MED ORDER — FENTANYL CITRATE (PF) 100 MCG/2ML IJ SOLN
50.0000 ug | INTRAMUSCULAR | Status: DC | PRN
  Administered 2019-09-14: 15 ug via INTRAVENOUS

## 2019-09-14 MED ORDER — PHENYLEPHRINE HCL-NACL 20-0.9 MG/250ML-% IV SOLN
INTRAVENOUS | Status: DC | PRN
  Administered 2019-09-14: 60 ug/min via INTRAVENOUS

## 2019-09-14 MED ORDER — PHENYLEPHRINE HCL-NACL 20-0.9 MG/250ML-% IV SOLN
INTRAVENOUS | Status: AC
  Filled 2019-09-14: qty 250

## 2019-09-14 MED ORDER — SIMETHICONE 80 MG PO CHEW: 80.0000 mg | CHEWABLE_TABLET | ORAL | Status: DC | PRN

## 2019-09-14 MED ORDER — TETANUS-DIPHTH-ACELL PERTUSSIS 5-2.5-18.5 LF-MCG/0.5 IM SUSP: 0.5000 mL | Freq: Once | INTRAMUSCULAR | Status: DC

## 2019-09-14 MED ORDER — OXYCODONE HCL 5 MG/5ML PO SOLN: 5.0000 mg | Freq: Once | ORAL | Status: DC | PRN

## 2019-09-14 MED ORDER — NALOXONE HCL 0.4 MG/ML IJ SOLN: 0.4000 mg | INTRAMUSCULAR | Status: DC | PRN

## 2019-09-14 MED ORDER — FENTANYL CITRATE (PF) 100 MCG/2ML IJ SOLN
INTRAMUSCULAR | Status: AC
  Filled 2019-09-14: qty 2

## 2019-09-14 MED ORDER — ONDANSETRON HCL 4 MG/2ML IJ SOLN
INTRAMUSCULAR | Status: AC
  Filled 2019-09-14: qty 2

## 2019-09-14 MED ORDER — KETOROLAC TROMETHAMINE 30 MG/ML IJ SOLN: 30.0000 mg | Freq: Once | INTRAMUSCULAR | Status: DC | PRN

## 2019-09-14 MED ORDER — BUPIVACAINE IN DEXTROSE 0.75-8.25 % IT SOLN
INTRATHECAL | Status: DC | PRN
  Administered 2019-09-14: 1.7 mL via INTRATHECAL

## 2019-09-14 MED ORDER — DIPHENHYDRAMINE HCL 25 MG PO CAPS: 25.0000 mg | ORAL_CAPSULE | ORAL | Status: DC | PRN

## 2019-09-14 MED ORDER — SENNOSIDES-DOCUSATE SODIUM 8.6-50 MG PO TABS
2.0000 | ORAL_TABLET | ORAL | Status: DC
  Administered 2019-09-14 – 2019-09-16 (×2): 2 via ORAL
  Filled 2019-09-14 (×2): qty 2

## 2019-09-14 MED ORDER — ONDANSETRON HCL 4 MG/2ML IJ SOLN
INTRAMUSCULAR | Status: DC | PRN
  Administered 2019-09-14: 4 mg via INTRAVENOUS

## 2019-09-14 MED ORDER — ACETAMINOPHEN 500 MG PO TABS
1000.0000 mg | ORAL_TABLET | Freq: Four times a day (QID) | ORAL | Status: DC
  Administered 2019-09-14 – 2019-09-16 (×9): 1000 mg via ORAL
  Filled 2019-09-14 (×9): qty 2

## 2019-09-14 MED ORDER — DIPHENHYDRAMINE HCL 25 MG PO CAPS: 25.0000 mg | ORAL_CAPSULE | Freq: Four times a day (QID) | ORAL | Status: DC | PRN

## 2019-09-14 MED ORDER — NALBUPHINE SYRINGE 5 MG/0.5 ML
5.0000 mg | INJECTION | INTRAMUSCULAR | Status: DC | PRN
  Filled 2019-09-14: qty 0.5

## 2019-09-14 MED ORDER — OXYCODONE HCL 5 MG PO TABS: 5.0000 mg | ORAL_TABLET | Freq: Once | ORAL | Status: DC | PRN

## 2019-09-14 MED ORDER — MORPHINE SULFATE (PF) 0.5 MG/ML IJ SOLN
INTRAMUSCULAR | Status: DC | PRN
  Administered 2019-09-14: .15 mg via EPIDURAL

## 2019-09-14 MED ORDER — WITCH HAZEL-GLYCERIN EX PADS: 1.0000 "application " | MEDICATED_PAD | CUTANEOUS | Status: DC | PRN

## 2019-09-14 MED ORDER — ONDANSETRON HCL 4 MG/2ML IJ SOLN: 4.0000 mg | Freq: Three times a day (TID) | INTRAMUSCULAR | Status: DC | PRN

## 2019-09-14 MED ORDER — OXYCODONE HCL 5 MG PO TABS: 10.0000 mg | ORAL_TABLET | ORAL | Status: DC | PRN

## 2019-09-14 MED ORDER — LACTATED RINGERS IV SOLN
INTRAVENOUS | Status: DC | PRN
  Administered 2019-09-14 (×2): via INTRAVENOUS

## 2019-09-14 MED ORDER — OXYTOCIN 40 UNITS IN NORMAL SALINE INFUSION - SIMPLE MED: 2.5000 [IU]/h | INTRAVENOUS | Status: DC

## 2019-09-14 MED ORDER — DIPHENHYDRAMINE HCL 50 MG/ML IJ SOLN: 12.5000 mg | INTRAMUSCULAR | Status: DC | PRN

## 2019-09-14 MED ORDER — LIDOCAINE HCL (PF) 1 % IJ SOLN: 30.0000 mL | INTRAMUSCULAR | Status: DC | PRN

## 2019-09-14 MED ORDER — NALOXONE HCL 4 MG/10ML IJ SOLN
1.0000 ug/kg/h | INTRAVENOUS | Status: DC | PRN
  Filled 2019-09-14: qty 5

## 2019-09-14 MED ORDER — LACTATED RINGERS IV BOLUS
1000.0000 mL | Freq: Once | INTRAVENOUS | Status: AC
  Administered 2019-09-14: 1000 mL via INTRAVENOUS

## 2019-09-14 MED ORDER — ONDANSETRON HCL 4 MG PO TABS: 4.0000 mg | ORAL_TABLET | ORAL | Status: DC | PRN

## 2019-09-14 MED ORDER — LACTATED RINGERS IV SOLN
INTRAVENOUS | Status: DC
  Administered 2019-09-14: 03:00:00 via INTRAVENOUS

## 2019-09-14 MED ORDER — SODIUM CHLORIDE 0.9 % IV SOLN
INTRAVENOUS | Status: DC | PRN
  Administered 2019-09-14: 05:00:00 via INTRAVENOUS

## 2019-09-14 MED ORDER — SODIUM CHLORIDE 0.9 % IR SOLN
Status: DC | PRN
  Administered 2019-09-14: 1000 mL

## 2019-09-14 MED ORDER — OXYTOCIN BOLUS FROM INFUSION: 500.0000 mL | Freq: Once | INTRAVENOUS | Status: DC

## 2019-09-14 MED ORDER — OXYTOCIN 40 UNITS IN NORMAL SALINE INFUSION - SIMPLE MED
INTRAVENOUS | Status: AC
  Filled 2019-09-14: qty 1000

## 2019-09-14 MED ORDER — SOD CITRATE-CITRIC ACID 500-334 MG/5ML PO SOLN
30.0000 mL | ORAL | Status: DC | PRN
  Administered 2019-09-14: 30 mL via ORAL
  Filled 2019-09-14: qty 30

## 2019-09-14 SURGICAL SUPPLY — 40 items
APL SKNCLS STERI-STRIP NONHPOA (GAUZE/BANDAGES/DRESSINGS) ×1
BENZOIN TINCTURE PRP APPL 2/3 (GAUZE/BANDAGES/DRESSINGS) ×3 IMPLANT
CHLORAPREP W/TINT 26ML (MISCELLANEOUS) ×3 IMPLANT
CLAMP CORD UMBIL (MISCELLANEOUS) IMPLANT
CLOSURE STERI STRIP 1/2 X4 (GAUZE/BANDAGES/DRESSINGS) ×1 IMPLANT
CLOSURE WOUND 1/2 X4 (GAUZE/BANDAGES/DRESSINGS) ×1
CLOTH BEACON ORANGE TIMEOUT ST (SAFETY) ×3 IMPLANT
DRSG OPSITE POSTOP 4X10 (GAUZE/BANDAGES/DRESSINGS) ×3 IMPLANT
ELECT REM PT RETURN 9FT ADLT (ELECTROSURGICAL) ×3
ELECTRODE REM PT RTRN 9FT ADLT (ELECTROSURGICAL) ×1 IMPLANT
EXTRACTOR VACUUM KIWI (MISCELLANEOUS) IMPLANT
GLOVE BIOGEL PI IND STRL 6.5 (GLOVE) ×1 IMPLANT
GLOVE BIOGEL PI IND STRL 7.0 (GLOVE) ×1 IMPLANT
GLOVE BIOGEL PI INDICATOR 6.5 (GLOVE) ×2
GLOVE BIOGEL PI INDICATOR 7.0 (GLOVE) ×2
GLOVE ECLIPSE 6.0 STRL STRAW (GLOVE) ×3 IMPLANT
GOWN STRL REUS W/TWL LRG LVL3 (GOWN DISPOSABLE) ×6 IMPLANT
KIT ABG SYR 3ML LUER SLIP (SYRINGE) IMPLANT
NDL HYPO 25X5/8 SAFETYGLIDE (NEEDLE) IMPLANT
NEEDLE HYPO 25X5/8 SAFETYGLIDE (NEEDLE) IMPLANT
NS IRRIG 1000ML POUR BTL (IV SOLUTION) ×3 IMPLANT
PACK C SECTION WH (CUSTOM PROCEDURE TRAY) ×3 IMPLANT
PAD OB MATERNITY 4.3X12.25 (PERSONAL CARE ITEMS) ×3 IMPLANT
PENCIL SMOKE EVAC W/HOLSTER (ELECTROSURGICAL) ×3 IMPLANT
RTRCTR C-SECT PINK 25CM LRG (MISCELLANEOUS) ×3 IMPLANT
STRIP CLOSURE SKIN 1/2X4 (GAUZE/BANDAGES/DRESSINGS) ×2 IMPLANT
SUT MNCRL 0 VIOLET CTX 36 (SUTURE) ×2 IMPLANT
SUT MNCRL AB 3-0 PS2 27 (SUTURE) ×3 IMPLANT
SUT MON AB 4-0 PS1 27 (SUTURE) ×2 IMPLANT
SUT MONOCRYL 0 CTX 36 (SUTURE) ×4
SUT PLAIN 0 NONE (SUTURE) IMPLANT
SUT PLAIN 2 0 (SUTURE) ×3
SUT PLAIN ABS 2-0 CT1 27XMFL (SUTURE) ×1 IMPLANT
SUT VIC AB 0 CTX 36 (SUTURE) ×6
SUT VIC AB 0 CTX36XBRD ANBCTRL (SUTURE) ×2 IMPLANT
SUT VIC AB 2-0 CT1 27 (SUTURE) ×3
SUT VIC AB 2-0 CT1 TAPERPNT 27 (SUTURE) ×1 IMPLANT
TOWEL OR 17X24 6PK STRL BLUE (TOWEL DISPOSABLE) ×3 IMPLANT
TRAY FOLEY W/BAG SLVR 14FR LF (SET/KITS/TRAYS/PACK) ×3 IMPLANT
WATER STERILE IRR 1000ML POUR (IV SOLUTION) ×3 IMPLANT

## 2019-09-14 NOTE — Transfer of Care (Signed)
Immediate Anesthesia Transfer of Care Note  Patient: Marissa Stevenson  Procedure(s) Performed: CESAREAN SECTION (N/A )  Patient Location: PACU  Anesthesia Type:Spinal  Level of Consciousness: awake, alert  and patient cooperative  Airway & Oxygen Therapy: Patient Spontanous Breathing  Post-op Assessment: Report given to RN and Post -op Vital signs reviewed and stable  Post vital signs: Reviewed and stable  Last Vitals:  Vitals Value Taken Time  BP 124/78 09/14/19 0550  Temp    Pulse 65 09/14/19 0558  Resp 19 09/14/19 0558  SpO2 99 % 09/14/19 0558  Vitals shown include unvalidated device data.  Last Pain:  Vitals:   09/14/19 0400  TempSrc:   PainSc: 7       Patients Stated Pain Goal: 0 (25/42/70 6237)  Complications: No apparent anesthesia complications

## 2019-09-14 NOTE — MAU Note (Signed)
Presents with cxts approx every 2-4 min apart since 2100 today. Dec FM. No vag bldg. Noticed some "leakage" prior to going to restroom.  Gilmer Mor RN

## 2019-09-14 NOTE — Op Note (Signed)
Cesarean Section Procedure Note  Pre-operative Diagnosis: 1. Intrauterine pregnancy at [redacted]w[redacted]d  2. Gestational hypertension  3.  Latent labor  4.  Fetal intolerance of labor  Post-operative Diagnosis: same as above  Surgeon: Marlow Baars, MD  Procedure: Primary low transverse cesarean section   Anesthesia: Spinal anesthesia  Estimated Blood Loss: 315 mL         Drains: Foley catheter         Specimens: placenta to patient              Complications:  None; patient tolerated the procedure well.         Disposition: PACU - hemodynamically stable.  Findings:  Poorly developed lower uterine segment. Thick meconium stained fluid.   Normal uterus, tubes and left ovary.  Right ovary not able to be visualized or palpated.  Placenta small and calcified.  Viable female infant, 2370g (5lb 3.6 oz) Apgars 8, 9.    Procedure Details   After spinal  anesthesia was found to adequate, the patient was placed in the dorsal supine position with a leftward tilt, prepped and draped in the usual sterile manner. A Pfannenstiel incision was made and carried down through the subcutaneous tissue to the fascia.  The fascia was incised in the midline and the fascial incision was extended laterally with Mayo scissors. The superior aspect of the fascial incision was grasped with two Kocher clamps, tented up and the rectus muscles dissected off sharply. The rectus was then dissected off with blunt dissection and Mayo scissors inferiorly. The rectus muscles were separated in the midline. The abdominal peritoneum was identified, tented up, entered bluntly, and the incision was extended superiorly and inferiorly with good visualization of the bladder. The Alexis retractor was deployed. The vesicouterine peritoneum was identified and well away from the site of the future hysterotomy.  There was not a well developed lower uterine segment. A scalpel was then used to make a low transverse incision on the uterus which was extended in  the cephalad-caudad direction with the bandage scissors.  The fluid was stained with thick meconium. The fetal vertex was identified, elevated out of the pelvis and brought to the hysterotomy.  The head was delivered easily followed by the shoulders and body.  After a 60 second delay per protocol, the cord was clamped and cut and the infant was passed to the waiting neonatologist.  The placenta was then delivered spontaneously, intact and appear small and calcified.  The uterus was cleared of all clot and debris.  The patient desired to keep her placenta, so it was not sent to pathology.   The hysterotomy was repaired with #0 Monocryl in running locked fashion.  A second imbricating layer of #0 Monocryl was placed.  The Alexis retractor was removed from the abdomen. The peritoneum was examined and all vessels noted to be hemostatic. The abdominal cavity was cleared of all clot and debris.  The peritoneum was closed with 2-0 vicryl in a running fashion.  The rectus muscles were then closed with 2-0 Vicryl. The fascia and rectus muscles were inspected and were hemostatic. The fascia was closed with 0 Vicryl in a running fashion. The subcutaneous layer was irrigated and all bleeders cauterized. The subcutaneous layer was closed with interrupted plain gut. The skin was closed with 3-0 monocryl in a subcuticular fashion. The incision was dressed with benzoine, steri strips and honeycomb dressing. All sponge lap and needle counts were correct x3. Patient tolerated the procedure well and recovered in  stable condition following the procedure.
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
## 2019-09-14 NOTE — Progress Notes (Signed)
Despite fluid resuscitation and position change, persistent category 2 tracing.  Fetal heart rate is in the 165-170s with recurrent late decelerations.  She is remote from delivery.  I recommend proceeding to the OR for primary cesarean section at this time.  We discussed the risks to cesarean section to include infection, bleeding, damage to surrounding structures (including but not limited to bowel, bladder, tubes, ovaries, nerves, vessels, baby), need for blood transfusion, venous thromboembolism, need for additional procedures.

## 2019-09-14 NOTE — Lactation Note (Signed)
This note was copied from a baby's chart. Lactation Consultation Note  Patient Name: Marissa Stevenson [id number] Date: 09/14/2019 Reason for consult: Initial assessment;Infant < 6lbs;Early term 37-38.6wks;Primapara Baby is 87 hours old.  Birth weight 5-3.6.  Baby has has low temps and blood sugars.  He has received glucose gel and two formula feedings.  Mom is exhausted.  Attempts made at breast but baby has not latched yet.  Reassured mom.  Late Preterm information given to mom due to small size.  Reviewed supplemental guidelines.  Symphony pump set up.  Mom has small areola and nipples so 21 flanges are a good fit.  Initiated pumping and instructed to pump every 3 hours x 15 minutes followed by hand expression..   Colostrum containers and spoons left at bedside. Mom has a Medela DEBP at home.  She is somewhat overwhelmed so teaching will need to be reinforced later.  FOB present and supportive.  Encouraged to call for assist/concerns prn.  Maternal Data Has patient been taught Hand Expression?: Yes Does the patient have breastfeeding experience prior to this delivery?: No  Feeding Feeding Type: Bottle Fed - Formula Nipple Type: Slow - flow  LATCH Score                   Interventions Interventions: DEBP  Lactation Tools Discussed/Used Pump Review: Setup, frequency, and cleaning;Milk Storage Initiated by:: [email or web address] Date initiated:: 09/14/19   Consult Status Consult Status: Follow-up Follow-up type: In-patient    Ave Filter 09/14/2019, 2:50 PM

## 2019-09-14 NOTE — Anesthesia Procedure Notes (Signed)
Spinal  Patient location during procedure: OR Start time: 09/14/2019 4:30 AM End time: 09/14/2019 4:40 AM Staffing Anesthesiologist: Pervis Hocking, DO Performed: anesthesiologist  Preanesthetic Checklist Completed: patient identified, surgical consent, pre-op evaluation, timeout performed, IV checked, risks and benefits discussed and monitors and equipment checked Spinal Block Patient position: sitting Prep: site prepped and draped and DuraPrep Patient monitoring: cardiac monitor, continuous pulse ox and blood pressure Approach: midline Location: L3-4 Injection technique: single-shot Needle Needle type: Pencan  Needle gauge: 24 G Needle length: 9 cm Assessment Sensory level: T6 Additional Notes Functioning IV was confirmed and monitors were applied. Sterile prep and drape, including hand hygiene and sterile gloves were used. The patient was positioned and the spine was prepped. The skin was anesthetized with lidocaine.  Free flow of clear CSF was obtained prior to injecting local anesthetic into the CSF.  The spinal needle aspirated freely following injection.  The needle was carefully withdrawn.  The patient tolerated the procedure well.

## 2019-09-14 NOTE — Anesthesia Preprocedure Evaluation (Signed)
Anesthesia Evaluation  Patient identified by MRN, date of birth, ID band Patient awake    Reviewed: Allergy & Precautions, NPO status , Patient's Chart, lab work & pertinent test results  Airway Mallampati: II  TM Distance: >3 FB Neck ROM: Full    Dental no notable dental hx.    Pulmonary neg pulmonary ROS,    Pulmonary exam normal breath sounds clear to auscultation       Cardiovascular hypertension, Normal cardiovascular exam Rhythm:Regular Rate:Normal     Neuro/Psych  Headaches, negative psych ROS   GI/Hepatic negative GI ROS, Neg liver ROS,   Endo/Other  negative endocrine ROS  Renal/GU negative Renal ROS  negative genitourinary   Musculoskeletal negative musculoskeletal ROS (+)   Abdominal   Peds negative pediatric ROS (+)  Hematology negative hematology ROS (+)   Anesthesia Other Findings   Reproductive/Obstetrics (+) Pregnancy                             Anesthesia Physical Anesthesia Plan  ASA: II and emergent  Anesthesia Plan: Spinal   Post-op Pain Management:  Regional for Post-op pain   Induction:   PONV Risk Score and Plan: 3 and Ondansetron, Dexamethasone and Treatment may vary due to age or medical condition  Airway Management Planned: Natural Airway and Nasal Cannula  Additional Equipment: None  Intra-op Plan:   Post-operative Plan:   Informed Consent: I have reviewed the patients History and Physical, chart, labs and discussed the procedure including the risks, benefits and alternatives for the proposed anesthesia with the patient or authorized representative who has indicated his/her understanding and acceptance.       Plan Discussed with: CRNA  Anesthesia Plan Comments:         Anesthesia Quick Evaluation

## 2019-09-14 NOTE — Lactation Note (Signed)
This note was copied from a baby's chart. Lactation Consultation Note Baby 85 hrs old. Transferred to NICU for temperature instability and low glucose. Mom resting awake. DEBP set up by RN. Encouraged pumping Q 3 hrs then hand expression afterwards. Mom has colostrum containers. States RN demonstrated hand expression.  Praised mom. Mom has been upset d/t baby transferred to NICU. Mom has LPI information paper at bedside, LC reviewed. LC reviewed supplementing, feeding time limits, covering, STS, I&O, milk storage for NICU, supply and demand. Encouraged mom to call for assistance or questions. Lactation brochure and NICU booklet given.  Patient Name: Marissa Stevenson [id number] Date: 09/14/2019 Reason for consult: Initial assessment;NICU baby;Infant < 6lbs;Primapara   Maternal Data Has patient been taught Hand Expression?: Yes Does the patient have breastfeeding experience prior to this delivery?: No  Feeding Feeding Type: Formula Nipple Type: Slow - flow  LATCH Score                   Interventions    Lactation Tools Discussed/Used WIC Program: No Pump Review: Setup, frequency, and cleaning;Milk Storage(LC reviewed milk storage for NICU baby) Initiated by:: RN Date initiated:: 09/14/19   Consult Status Consult Status: Follow-up Date: 09/15/19 Follow-up type: In-patient    Theodoro Kalata 09/14/2019, 10:54 PM
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
## 2019-09-14 NOTE — Anesthesia Postprocedure Evaluation (Signed)
Anesthesia Post Note  Patient: Marissa Stevenson  Procedure(s) Performed: CESAREAN SECTION (N/A )     Patient location during evaluation: PACU Anesthesia Type: Spinal Level of consciousness: oriented and awake and alert Pain management: pain level controlled Vital Signs Assessment: post-procedure vital signs reviewed and stable Respiratory status: spontaneous breathing and respiratory function stable Cardiovascular status: blood pressure returned to baseline and stable Postop Assessment: no headache, no backache, no apparent nausea or vomiting and spinal receding Anesthetic complications: no    Last Vitals:  Vitals:   09/14/19 0600 09/14/19 0615  BP: 116/71 (!) 126/97  Pulse: 68 65  Resp: 16 (!) 24  Temp:    SpO2: 99% 100%    Last Pain:  Vitals:   09/14/19 0615  TempSrc:   PainSc: (P) 0-No pain   Pain Goal: Patients Stated Pain Goal: 0 (09/14/19 0400)  LLE Motor Response: (P) Purposeful movement (09/14/19 0615) LLE Sensation: (P) Tingling (09/14/19 0615) RLE Motor Response: (P) Purposeful movement (09/14/19 0615) RLE Sensation: (P) Tingling (09/14/19 0615)     Epidural/Spinal Function Cutaneous sensation: (P) Tingles (09/14/19 0615), Patient able to flex knees: (P) No (09/14/19 0615), Patient able to lift hips off bed: (P) No (09/14/19 0615), Back pain beyond tenderness at insertion site: (P) No (09/14/19 0615), Progressively worsening motor and/or sensory loss: (P) No (09/14/19 0615), Bowel and/or bladder incontinence post epidural: (P) No (09/14/19 0615)  Pervis Hocking

## 2019-09-15 ENCOUNTER — Encounter (HOSPITAL_COMMUNITY): Payer: Self-pay | Admitting: *Deleted

## 2019-09-15 LAB — CBC
HCT: 29 % — ABNORMAL LOW (ref 36.0–46.0)
Hemoglobin: 9.4 g/dL — ABNORMAL LOW (ref 12.0–15.0)
MCH: 28 pg (ref 26.0–34.0)
MCHC: 32.4 g/dL (ref 30.0–36.0)
MCV: 86.3 fL (ref 80.0–100.0)
Platelets: 165 10*3/uL (ref 150–400)
RBC: 3.36 MIL/uL — ABNORMAL LOW (ref 3.87–5.11)
RDW: 13.5 % (ref 11.5–15.5)
WBC: 11 10*3/uL — ABNORMAL HIGH (ref 4.0–10.5)
nRBC: 0 % (ref 0.0–0.2)

## 2019-09-15 NOTE — Progress Notes (Signed)
  Patient is eating, ambulating, voiding.  Pain control is good.  Vitals:   09/14/19 1230 09/14/19 1700 09/14/19 2134 09/15/19 0603  BP:   119/70 130/73  Pulse: 63  65 72  Resp: 18 16 17 19   Temp: 97.9 F (36.6 C) 98.5 F (36.9 C) 97.6 F (36.4 C) 97.9 F (36.6 C)  TempSrc: Oral Oral Oral Oral  SpO2: 99% 100% 100% 100%  Weight:      Height:        lungs:   clear to auscultation cor:    RRR Abdomen:  soft, appropriate tenderness, incisions intact and without erythema or exudate ex:    no cords   Lab Results  Component Value Date   WBC 11.0 (H) 09/15/2019   HGB 9.4 (L) 09/15/2019   HCT 29.0 (L) 09/15/2019   MCV 86.3 09/15/2019   PLT 165 09/15/2019    --/--/O NEG (10/04 0115)/RI  A/P    Post operative day 2 with GHTN, not severe.  Routine post op and postpartum care.  Expect d/c  Tomorrow.   Baby O neg- no Rhogam needed.  Percocet for pain control.

## 2019-09-15 NOTE — Progress Notes (Signed)
Patient screened out for psychosocial assessment since none of the following apply: °Psychosocial stressors documented in mother or baby's chart °Gestation less than 32 weeks °Code at delivery  °Infant with anomalies °Please contact the Clinical Social Worker if specific needs arise, by MOB's request, or if MOB scores greater than 9/yes to question 10 on Edinburgh Postpartum Depression Screen. ° °Ouida Abeyta Boyd-Gilyard, MSW, LCSW °Clinical Social Work °[phone] °  °

## 2019-09-15 NOTE — Lactation Note (Signed)
This note was copied from a baby's chart. Lactation Consultation Note  Patient Name: Marissa Stevenson [id number] Date: 09/15/2019 Reason for consult: Early term 37-38.6wks;Primapara;1st time breastfeeding;NICU baby;Infant < 6lbs  P1 mother whose infant is now 58 hours old.  This is an ETI at 38+2 weeks weighing <6 lbs and in the NICU.  Baby was admitted for temperature instability and hypoglycemia.  Visited with father in NICU first.  After briefly speaking with father I went to mother's room and spent a good amount of time comforting and answering questions.  Mother is tired and has a sore back from her epidural.  She has been trying to pump every three hours and has not observed any colostrum drops yet.  Praised her efforts and set up a pumping schedule for her which includes one interval of 4 hours uninterrupted night time sleep.  Encouraged her to continue hand expression before/after feedings to help increase supply.  She had not been doing this.  She has colostrum containers and labels.  Baby had a colostrum drop today swabbed in his cheek.    Encouraged mother to pump at baby's bedside in the NICU.  Demonstrated how to remove pump parts and transport all pieces to the NICU.  Mother is interested in doing this.  Suggested she speak with her RN and begin doing STS in the NICU; explained the benefits of STS.  Let her know that, when baby is ready to begin latching, RN/LC will be available to assist.  This made mother feel more comfortable.  Asked her to call for any further questions/concerns she may have.  Mother appreciative of my visit.     Maternal Data Formula Feeding for Exclusion: No Has patient been taught Hand Expression?: Yes Does the patient have breastfeeding experience prior to this delivery?: No  Feeding Feeding Type: Formula  LATCH Score                   Interventions    Lactation Tools Discussed/Used WIC Program: No Pump Review: (Reviewed and answered  mother's questions)   Consult Status Consult Status: Follow-up Date: 09/16/19 Follow-up type: In-patient    Little Ishikawa 09/15/2019, 9:14 PM

## 2019-09-16 ENCOUNTER — Ambulatory Visit: Payer: Self-pay

## 2019-09-16 MED ORDER — IBUPROFEN 600 MG PO TABS: 600.0000 mg | ORAL_TABLET | Freq: Four times a day (QID) | ORAL | 0 refills | Status: DC | PRN

## 2019-09-16 NOTE — Lactation Note (Signed)
This note was copied from a baby's chart. Lactation Consultation Note  Patient Name: Marissa Stevenson [id number] Date: 09/16/2019 Reason for consult: Follow-up assessment  1729 - I followed up with Marissa Stevenson upon request by [facility] who saw her earlier today. Marissa Stevenson was holding baby upon entry in swaddle. I asked her if she had any questions or needed any help, and she declined. She appeared emotional upon entry upon learning about a new development regarding her son. She has been unable to hold baby skin to skin (he was swaddled in her arms) due to extenuating circumstances, but she plans to do so when it becomes possible.  I encouraged Marissa Stevenson to call lactation anytime she needs assistance. Marissa Stevenson brought her DEBP kit with her to the hospital and plans to use it. I reminded her to pump 8 times a day for 15 minutes.   She thanked me for the visit and had no further questions.   Feeding Feeding Type: Formula Nipple Type: Nfant Slow Flow (purple)  Consult Status Consult Status: Follow-up Date: 09/17/19 Follow-up type: In-patient    Marissa Stevenson 09/16/2019, 6:10 PM

## 2019-09-16 NOTE — Lactation Note (Signed)
This note was copied from a baby's chart. Lactation Consultation Note:  Infant is in NICU. Infant is 38.2 weeks. Infant is now 64 hours old.  Infant has had difficulty with maintaining blood sugar.   Mother has been pumping but reports that she has not obtained any colostrum.  Assist mother with hand expression. Expressed 2-3 ml in a colostrum vial. Mother very elated to see her milk.   Mother to be discharged today and plans to go to NICU and stay.  Discussed importance of mother getting proper rest.  Reviewed importance of consistent pumping. Advised mother to pump every 2-3 hours for 15-20 mins. Mother to pump at least 8 times in 24 hours.   Mother reports that she plans to do STS today with infant.  Advised mother to have [place] paged to infants room for feeding assessment . Infant is being formula fed now.   Discussed treatment and prevention of engorgement.   Mother was given a hard pump to use . She was fit with the #21 flange. Mother advised to hand express before and after each pumping.   Discussed transporting ebm to the hospital. Mother reports that she has a breastmilk cooler. Reviewed storage guidelines in NICU booklet.  Parents very receptive to all teaching.   Mother has a Medela pump at home. Discussed the use of the Symphony in infants room.   Mother is aware of available [place] services and community support.     Patient Name: Marissa Stevenson [id number] Date: 09/16/2019 Reason for consult: Follow-up assessment   Maternal Data Has patient been taught Hand Expression?: (P) Yes  Feeding Feeding Type: Formula Nipple Type: Nfant Slow Flow (purple)  LATCH Score                   Interventions Interventions: Hand express;Expressed milk;Comfort gels;Hand pump;DEBP  Lactation Tools Discussed/Used     Consult Status Consult Status: Follow-up(mother to phone for [facility] assistance while in the NICU)    Darla Lesches 09/16/2019, 12:56 PM

## 2019-09-16 NOTE — Discharge Summary (Signed)
Obstetric Discharge Summary Reason for Admission: onset of labor and GHTN Prenatal Procedures: Preeclampsia and ultrasound Intrapartum Procedures: cesarean: low cervical, transverse Postpartum Procedures: none Complications-Operative and Postpartum: none Hemoglobin  Date Value Ref Range Status  09/15/2019 9.4 (L) 12.0 - 15.0 g/dL Final    Comment:    REPEATED TO VERIFY   HCT  Date Value Ref Range Status  09/15/2019 29.0 (L) 36.0 - 46.0 % Final    Physical Exam:  General: alert and cooperative Lochia: appropriate Uterine Fundus: firm Incision: healing well, no significant drainage DVT Evaluation: No evidence of DVT seen on physical exam.  Discharge Diagnoses: Term Pregnancy-delivered  Discharge Information: Date: 09/16/2019 Activity: pelvic rest Diet: routine Medications: PNV and Ibuprofen Condition: stable Instructions: refer to practice specific booklet Discharge to: home Follow-up Information    Jerelyn Charles, MD Follow up in 4 week(s).   Specialty: Obstetrics Contact information: [place] [address] [phone]           Newborn Data: Live born female  Birth Weight: 5 lb 3.6 oz (2370 g) APGAR: 8, 9  Newborn Delivery   Birth date/time: 09/14/2019 04:59:00 Delivery type: C-Section, Low Transverse Trial of labor: Yes C-section categorization: Primary      Baby to remain in NICU at discharge of mother.  Allyn Kenner 09/16/2019, 9:32 AM

## 2019-09-17 LAB — BPAM RBC
Blood Product Expiration Date: 202010312359
Blood Product Expiration Date: 202011082359
ISSUE DATE / TIME: 202010032212
ISSUE DATE / TIME: 202010032212
Unit Type and Rh: 9500
Unit Type and Rh: 9500

## 2019-09-17 LAB — TYPE AND SCREEN
ABO/RH(D): O NEG
Antibody Screen: POSITIVE
Unit division: 0
Unit division: 0

## 2019-09-20 ENCOUNTER — Ambulatory Visit: Payer: Self-pay

## 2019-09-20 NOTE — Lactation Note (Signed)
This note was copied from a baby's chart. Lactation Consultation Note  Patient Name: Boy Karia Pyper [id number] Date: 09/20/2019   Baby 28 days old in NICU being discharged today.  < 6 lbs. Mother started latching baby yesterday and feels good about latch.  LC did not view latch since baby had recently finished feeding. Mother requested lactation due to concerns regarding a drop in her supply. Reviewed her current pumping frequency and mother is pumping 7 times per day. Recommend pumping at least 8 times per day. Mother has been told about the hand on pumping video but has not watched it yet. FOB pulled video up on his phone for both of them to watch. Encouraged STS, hands on pumping and be sure to pump between 12a-5a. Discussed feeding plan and mother will check with Ped MD regarding supplemental formula.   Recommend limiting bf session to 20-25 min and if baby is too sleepy to latch, give breastmilk in bottle first.  Reviewed paced feeding. Mother asked about taking supplement.  Recommend waiting at least 2 weeks before trying fenugreek.        Maternal Data    Feeding Feeding Type: Breast Fed Nipple Type: Slow - flow  LATCH Score                   Interventions    Lactation Tools Discussed/Used     Consult Status      Carlye Grippe 09/20/2019, 1:14 PM

## 2020-09-01 ENCOUNTER — Other Ambulatory Visit: Payer: Self-pay

## 2020-09-01 ENCOUNTER — Encounter: Payer: Self-pay | Admitting: Emergency Medicine

## 2020-09-01 ENCOUNTER — Ambulatory Visit
Admission: EM | Admit: 2020-09-01 | Discharge: 2020-09-01 | Disposition: A | Discharge status: Home / Self Care | Payer: BLUE CROSS/BLUE SHIELD | Attending: Emergency Medicine | Admitting: Emergency Medicine

## 2020-09-01 DIAGNOSIS — R0981 Nasal congestion: Secondary | ICD-10-CM

## 2020-09-01 DIAGNOSIS — J3489 Other specified disorders of nose and nasal sinuses: Secondary | ICD-10-CM | POA: Diagnosis not present

## 2020-09-01 DIAGNOSIS — J0100 Acute maxillary sinusitis, unspecified: Secondary | ICD-10-CM

## 2020-09-01 MED ORDER — AMOXICILLIN-POT CLAVULANATE 875-125 MG PO TABS: 1.0000 | ORAL_TABLET | Freq: Two times a day (BID) | ORAL | 0 refills | Status: AC

## 2020-09-01 MED ORDER — DEXAMETHASONE SODIUM PHOSPHATE 10 MG/ML IJ SOLN
10.0000 mg | Freq: Once | INTRAMUSCULAR | Status: AC
  Administered 2020-09-01: 10 mg via INTRAMUSCULAR

## 2020-09-01 NOTE — ED Triage Notes (Signed)
Headache, sinus tenderness and swelling to LT side of face since this morning. Has tried allergy medication and 600mg  of ibuprofen with no relief.

## 2020-09-01 NOTE — Discharge Instructions (Signed)
Declines COVID test Get plenty of rest and push fluids Steroid shot given in office Augmentin prescribed for possible sinus infection Use OTC medications like ibuprofen or tylenol as needed fever or pain Call or go to the ED if you have any new or worsening symptoms such as fever, cough, shortness of breath, chest tightness, chest pain, turning blue, changes in mental status, etc..Marland Kitchen
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
## 2021-02-19 ENCOUNTER — Ambulatory Visit
Admission: RE | Admit: 2021-02-19 | Discharge: 2021-02-19 | Disposition: A | Discharge status: Home / Self Care | Payer: BC Managed Care – PPO | Source: Ambulatory Visit | Attending: Emergency Medicine | Admitting: Emergency Medicine

## 2021-02-19 ENCOUNTER — Other Ambulatory Visit: Payer: Self-pay

## 2021-02-19 VITALS — BP 130/77 | HR 82 | Temp 97.3°F | Resp 16

## 2021-02-19 DIAGNOSIS — M542 Cervicalgia: Secondary | ICD-10-CM

## 2021-02-19 MED ORDER — DEXAMETHASONE SODIUM PHOSPHATE 10 MG/ML IJ SOLN
10.0000 mg | Freq: Once | INTRAMUSCULAR | Status: AC
  Administered 2021-02-19: 10 mg via INTRAMUSCULAR

## 2021-02-19 MED ORDER — CYCLOBENZAPRINE HCL 10 MG PO TABS: 10.0000 mg | ORAL_TABLET | Freq: Every day | ORAL | 0 refills | Status: DC

## 2021-02-19 MED ORDER — PREDNISONE 10 MG PO TABS: 20.0000 mg | ORAL_TABLET | Freq: Every day | ORAL | 0 refills | Status: DC
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
## 2021-02-19 NOTE — ED Triage Notes (Signed)
Pain to back of neck when moving head up and down.  Pain started after trying to lift her child last night.

## 2021-02-19 NOTE — Discharge Instructions (Addendum)
Rest, ice and heat as needed Ensure adequate ROM as tolerated. Continue ibuprofen as needed for pain relief Prescribed prednisone/take as directed Prescribed flexeril  for muscle spasm.  Do not drive or operate heavy machinery while taking this medication Return here or go to ER if you have any new or worsening symptoms such as numbness/tingling of the inner thighs, loss of bladder or bowel control, headache/blurry vision, nausea/vomiting, confusion/altered mental status, dizziness, weakness, passing out, imbalance, etc..Marland Kitchen

## 2022-02-08 DIAGNOSIS — F4322 Adjustment disorder with anxiety: Secondary | ICD-10-CM | POA: Diagnosis not present

## 2022-03-01 DIAGNOSIS — Z0000 Encounter for general adult medical examination without abnormal findings: Secondary | ICD-10-CM | POA: Diagnosis not present

## 2022-03-01 DIAGNOSIS — Z13220 Encounter for screening for lipoid disorders: Secondary | ICD-10-CM | POA: Diagnosis not present

## 2022-03-01 DIAGNOSIS — Z1231 Encounter for screening mammogram for malignant neoplasm of breast: Secondary | ICD-10-CM | POA: Diagnosis not present

## 2022-03-01 DIAGNOSIS — R0789 Other chest pain: Secondary | ICD-10-CM | POA: Diagnosis not present

## 2022-03-02 DIAGNOSIS — R079 Chest pain, unspecified: Secondary | ICD-10-CM | POA: Diagnosis not present

## 2022-04-13 DIAGNOSIS — F4322 Adjustment disorder with anxiety: Secondary | ICD-10-CM | POA: Diagnosis not present

## 2022-04-26 DIAGNOSIS — Z01419 Encounter for gynecological examination (general) (routine) without abnormal findings: Secondary | ICD-10-CM | POA: Diagnosis not present

## 2022-04-26 DIAGNOSIS — Z113 Encounter for screening for infections with a predominantly sexual mode of transmission: Secondary | ICD-10-CM | POA: Diagnosis not present

## 2022-04-26 DIAGNOSIS — Z3202 Encounter for pregnancy test, result negative: Secondary | ICD-10-CM | POA: Diagnosis not present

## 2022-04-26 DIAGNOSIS — N925 Other specified irregular menstruation: Secondary | ICD-10-CM | POA: Diagnosis not present

## 2022-04-26 DIAGNOSIS — Z6822 Body mass index (BMI) 22.0-22.9, adult: Secondary | ICD-10-CM | POA: Diagnosis not present

## 2022-05-02 DIAGNOSIS — R112 Nausea with vomiting, unspecified: Secondary | ICD-10-CM | POA: Diagnosis not present

## 2022-05-02 DIAGNOSIS — O26841 Uterine size-date discrepancy, first trimester: Secondary | ICD-10-CM | POA: Diagnosis not present

## 2022-05-02 DIAGNOSIS — Z3480 Encounter for supervision of other normal pregnancy, unspecified trimester: Secondary | ICD-10-CM | POA: Diagnosis not present

## 2022-05-02 DIAGNOSIS — Z3A01 Less than 8 weeks gestation of pregnancy: Secondary | ICD-10-CM | POA: Diagnosis not present

## 2022-05-10 DIAGNOSIS — Z369 Encounter for antenatal screening, unspecified: Secondary | ICD-10-CM | POA: Diagnosis not present

## 2022-05-17 DIAGNOSIS — Z3A08 8 weeks gestation of pregnancy: Secondary | ICD-10-CM | POA: Diagnosis not present

## 2022-05-17 DIAGNOSIS — O3680X1 Pregnancy with inconclusive fetal viability, fetus 1: Secondary | ICD-10-CM | POA: Diagnosis not present

## 2022-05-18 DIAGNOSIS — F4322 Adjustment disorder with anxiety: Secondary | ICD-10-CM | POA: Diagnosis not present

## 2022-05-31 DIAGNOSIS — O3680X1 Pregnancy with inconclusive fetal viability, fetus 1: Secondary | ICD-10-CM | POA: Diagnosis not present

## 2022-05-31 DIAGNOSIS — Z3480 Encounter for supervision of other normal pregnancy, unspecified trimester: Secondary | ICD-10-CM | POA: Diagnosis not present

## 2022-05-31 DIAGNOSIS — Z3A10 10 weeks gestation of pregnancy: Secondary | ICD-10-CM | POA: Diagnosis not present

## 2022-05-31 LAB — OB RESULTS CONSOLE HIV ANTIBODY (ROUTINE TESTING): HIV: NONREACTIVE

## 2022-05-31 LAB — OB RESULTS CONSOLE RPR: RPR: NONREACTIVE

## 2022-05-31 LAB — OB RESULTS CONSOLE RUBELLA ANTIBODY, IGM: Rubella: IMMUNE

## 2022-05-31 LAB — OB RESULTS CONSOLE HEPATITIS B SURFACE ANTIGEN: Hepatitis B Surface Ag: NEGATIVE

## 2022-06-15 DIAGNOSIS — F4322 Adjustment disorder with anxiety: Secondary | ICD-10-CM | POA: Diagnosis not present

## 2022-06-22 DIAGNOSIS — Z369 Encounter for antenatal screening, unspecified: Secondary | ICD-10-CM | POA: Diagnosis not present

## 2022-06-26 ENCOUNTER — Ambulatory Visit
Admission: EM | Admit: 2022-06-26 | Discharge: 2022-06-26 | Disposition: A | Discharge status: Home / Self Care | Payer: BC Managed Care – PPO | Attending: Nurse Practitioner | Admitting: Nurse Practitioner

## 2022-06-26 DIAGNOSIS — J029 Acute pharyngitis, unspecified: Secondary | ICD-10-CM | POA: Diagnosis not present

## 2022-06-26 LAB — POCT RAPID STREP A (OFFICE): Rapid Strep A Screen: NEGATIVE

## 2022-06-26 MED ORDER — AMOXICILLIN 500 MG PO CAPS: 500.0000 mg | ORAL_CAPSULE | Freq: Two times a day (BID) | ORAL | 0 refills | Status: AC

## 2022-06-26 NOTE — ED Triage Notes (Signed)
Pt presents with c/o sore throat that first began last Wednesday, has had low grade fever

## 2022-06-26 NOTE — Discharge Instructions (Signed)
-   Rapid strep throat test today is negative; I would still like to treat you for strep throat based on your symptoms.  Please start the amoxicillin 500 mg take it twice daily for 10 days -Use nasal saline rinses to help with congestion, warm liquids with lemon and honey to help with sore throat/cough -Change toothbrush after starting treatment -Seek care if symptoms persist or worsen despite treatment
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
## 2022-07-14 DIAGNOSIS — Z3482 Encounter for supervision of other normal pregnancy, second trimester: Secondary | ICD-10-CM | POA: Diagnosis not present

## 2022-07-14 DIAGNOSIS — Z369 Encounter for antenatal screening, unspecified: Secondary | ICD-10-CM | POA: Diagnosis not present

## 2022-07-18 DIAGNOSIS — F4322 Adjustment disorder with anxiety: Secondary | ICD-10-CM | POA: Diagnosis not present

## 2022-08-04 DIAGNOSIS — Z3A19 19 weeks gestation of pregnancy: Secondary | ICD-10-CM | POA: Diagnosis not present

## 2022-08-04 DIAGNOSIS — Z363 Encounter for antenatal screening for malformations: Secondary | ICD-10-CM | POA: Diagnosis not present

## 2022-08-30 DIAGNOSIS — O26842 Uterine size-date discrepancy, second trimester: Secondary | ICD-10-CM | POA: Diagnosis not present

## 2022-08-30 DIAGNOSIS — Z3A23 23 weeks gestation of pregnancy: Secondary | ICD-10-CM | POA: Diagnosis not present

## 2022-09-21 DIAGNOSIS — Z369 Encounter for antenatal screening, unspecified: Secondary | ICD-10-CM | POA: Diagnosis not present

## 2022-10-02 DIAGNOSIS — Z369 Encounter for antenatal screening, unspecified: Secondary | ICD-10-CM | POA: Diagnosis not present

## 2022-10-02 DIAGNOSIS — Z3480 Encounter for supervision of other normal pregnancy, unspecified trimester: Secondary | ICD-10-CM | POA: Diagnosis not present

## 2022-10-04 DIAGNOSIS — F4322 Adjustment disorder with anxiety: Secondary | ICD-10-CM | POA: Diagnosis not present

## 2022-10-05 DIAGNOSIS — O360990 Maternal care for other rhesus isoimmunization, unspecified trimester, not applicable or unspecified: Secondary | ICD-10-CM | POA: Diagnosis not present

## 2022-10-18 DIAGNOSIS — Z3A30 30 weeks gestation of pregnancy: Secondary | ICD-10-CM | POA: Diagnosis not present

## 2022-10-18 DIAGNOSIS — O26843 Uterine size-date discrepancy, third trimester: Secondary | ICD-10-CM | POA: Diagnosis not present

## 2022-11-06 DIAGNOSIS — Z369 Encounter for antenatal screening, unspecified: Secondary | ICD-10-CM | POA: Diagnosis not present

## 2022-11-14 DIAGNOSIS — F4322 Adjustment disorder with anxiety: Secondary | ICD-10-CM | POA: Diagnosis not present

## 2022-11-20 DIAGNOSIS — Z87898 Personal history of other specified conditions: Secondary | ICD-10-CM | POA: Diagnosis not present

## 2022-11-20 DIAGNOSIS — Z3A34 34 weeks gestation of pregnancy: Secondary | ICD-10-CM | POA: Diagnosis not present

## 2022-12-01 ENCOUNTER — Ambulatory Visit (INDEPENDENT_AMBULATORY_CARE_PROVIDER_SITE_OTHER): Payer: BC Managed Care – PPO | Admitting: Cardiology

## 2022-12-01 ENCOUNTER — Encounter: Payer: Self-pay | Admitting: Cardiology

## 2022-12-01 VITALS — BP 154/97 | HR 65 | Ht 67.0 in | Wt 184.1 lb

## 2022-12-01 DIAGNOSIS — O10013 Pre-existing essential hypertension complicating pregnancy, third trimester: Secondary | ICD-10-CM

## 2022-12-01 DIAGNOSIS — O133 Gestational [pregnancy-induced] hypertension without significant proteinuria, third trimester: Secondary | ICD-10-CM | POA: Diagnosis not present

## 2022-12-01 DIAGNOSIS — R0602 Shortness of breath: Secondary | ICD-10-CM

## 2022-12-01 DIAGNOSIS — Z3A36 36 weeks gestation of pregnancy: Secondary | ICD-10-CM

## 2022-12-01 DIAGNOSIS — O09523 Supervision of elderly multigravida, third trimester: Secondary | ICD-10-CM

## 2022-12-01 DIAGNOSIS — R079 Chest pain, unspecified: Secondary | ICD-10-CM | POA: Diagnosis not present

## 2022-12-01 MED ORDER — NIFEDIPINE ER OSMOTIC RELEASE 30 MG PO TB24: 30.0000 mg | ORAL_TABLET | Freq: Every day | ORAL | 3 refills | Status: DC
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
## 2022-12-01 NOTE — Patient Instructions (Signed)
Medication Instructions:  Your physician has recommended you make the following change in your medication:  START: Nifedipine 30mg  daily.  *If you need a refill on your cardiac medications before your next appointment, please call your pharmacy*   Lab Work: Your physician recommends that you have the following labs drawn today: BMET, Magnesium, and Lft's  If you have labs (blood work) drawn today and your tests are completely normal, you will receive your results only by: MyChart Message (if you have MyChart) OR A paper copy in the mail If you have any lab test that is abnormal or we need to change your treatment, we will call you to review the results.   Testing/Procedures: Your physician has requested that you have an echocardiogram. Echocardiography is a painless test that uses sound waves to create images of your heart. It provides your doctor with information about the size and shape of your heart and how well your heart's chambers and valves are working. This procedure takes approximately one hour. There are no restrictions for this procedure. Please do NOT wear cologne, perfume, aftershave, or lotions (deodorant is allowed). Please arrive 15 minutes prior to your appointment time.    Follow-Up: At [facility], you and your health needs are our priority.  As part of our continuing mission to provide you with exceptional heart care, we have created designated Provider Care Teams.  These Care Teams include your primary Cardiologist (physician) and Advanced Practice Providers (APPs -  Physician Assistants and Nurse Practitioners) who all work together to provide you with the care you need, when you need it.  We recommend signing up for the patient portal called "MyChart".  Sign up information is provided on this After Visit Summary.  MyChart is used to connect with patients for Virtual Visits (Telemedicine).  Patients are able to view lab/test results, encounter notes, upcoming  appointments, etc.  Non-urgent messages can be sent to your provider as well.   To learn more about what you can do with MyChart, go to [facility].    Your next appointment:   12/27 double book at [facility]   The format for your next appointment:   In Person  Provider:   1/28, DO  [address] [place], [place]

## 2022-12-02 ENCOUNTER — Inpatient Hospital Stay (HOSPITAL_COMMUNITY): Payer: BC Managed Care – PPO | Admitting: Anesthesiology

## 2022-12-02 ENCOUNTER — Encounter (HOSPITAL_COMMUNITY): Payer: Self-pay | Admitting: Obstetrics and Gynecology

## 2022-12-02 ENCOUNTER — Encounter (HOSPITAL_COMMUNITY)
Admission: AD | Disposition: A | Discharge status: Home / Self Care | Payer: Self-pay | Source: Home / Self Care | Attending: Obstetrics and Gynecology

## 2022-12-02 ENCOUNTER — Other Ambulatory Visit: Payer: Self-pay

## 2022-12-02 ENCOUNTER — Inpatient Hospital Stay (HOSPITAL_COMMUNITY)
Admission: AD | Admit: 2022-12-02 | Discharge: 2022-12-04 | DRG: 787 | Disposition: A | Discharge status: Home / Self Care | Payer: BC Managed Care – PPO | Attending: Obstetrics and Gynecology | Admitting: Obstetrics and Gynecology

## 2022-12-02 DIAGNOSIS — A6000 Herpesviral infection of urogenital system, unspecified: Secondary | ICD-10-CM | POA: Diagnosis not present

## 2022-12-02 DIAGNOSIS — O34211 Maternal care for low transverse scar from previous cesarean delivery: Secondary | ICD-10-CM | POA: Diagnosis not present

## 2022-12-02 DIAGNOSIS — Z2882 Immunization not carried out because of caregiver refusal: Secondary | ICD-10-CM | POA: Diagnosis not present

## 2022-12-02 DIAGNOSIS — D62 Acute posthemorrhagic anemia: Secondary | ICD-10-CM | POA: Diagnosis not present

## 2022-12-02 DIAGNOSIS — R519 Headache, unspecified: Secondary | ICD-10-CM

## 2022-12-02 DIAGNOSIS — O1414 Severe pre-eclampsia complicating childbirth: Secondary | ICD-10-CM | POA: Diagnosis not present

## 2022-12-02 DIAGNOSIS — O26893 Other specified pregnancy related conditions, third trimester: Secondary | ICD-10-CM | POA: Diagnosis not present

## 2022-12-02 DIAGNOSIS — Z6791 Unspecified blood type, Rh negative: Secondary | ICD-10-CM

## 2022-12-02 DIAGNOSIS — O43813 Placental infarction, third trimester: Secondary | ICD-10-CM | POA: Diagnosis not present

## 2022-12-02 DIAGNOSIS — Z051 Observation and evaluation of newborn for suspected infectious condition ruled out: Secondary | ICD-10-CM | POA: Diagnosis not present

## 2022-12-02 DIAGNOSIS — O9081 Anemia of the puerperium: Secondary | ICD-10-CM | POA: Diagnosis not present

## 2022-12-02 DIAGNOSIS — O9832 Other infections with a predominantly sexual mode of transmission complicating childbirth: Secondary | ICD-10-CM | POA: Diagnosis not present

## 2022-12-02 DIAGNOSIS — O1493 Unspecified pre-eclampsia, third trimester: Secondary | ICD-10-CM

## 2022-12-02 DIAGNOSIS — O1410 Severe pre-eclampsia, unspecified trimester: Secondary | ICD-10-CM | POA: Diagnosis present

## 2022-12-02 DIAGNOSIS — Z3A36 36 weeks gestation of pregnancy: Secondary | ICD-10-CM

## 2022-12-02 DIAGNOSIS — R030 Elevated blood-pressure reading, without diagnosis of hypertension: Secondary | ICD-10-CM | POA: Diagnosis not present

## 2022-12-02 LAB — COMPREHENSIVE METABOLIC PANEL
ALT: 11 U/L (ref 0–44)
AST: 21 U/L (ref 15–41)
Albumin: 2.8 g/dL — ABNORMAL LOW (ref 3.5–5.0)
Alkaline Phosphatase: 200 U/L — ABNORMAL HIGH (ref 38–126)
Anion gap: 9 (ref 5–15)
BUN: 7 mg/dL (ref 6–20)
CO2: 18 mmol/L — ABNORMAL LOW (ref 22–32)
Calcium: 8.6 mg/dL — ABNORMAL LOW (ref 8.9–10.3)
Chloride: 108 mmol/L (ref 98–111)
Creatinine, Ser: 0.59 mg/dL (ref 0.44–1.00)
GFR, Estimated: >60 mL/min (ref >60)
Glucose, Bld: 83 mg/dL (ref 70–99)
Potassium: 3.8 mmol/L (ref 3.5–5.1)
Sodium: 135 mmol/L (ref 135–145)
Total Bilirubin: 0.7 mg/dL (ref 0.3–1.2)
Total Protein: 6.7 g/dL (ref 6.5–8.1)

## 2022-12-02 LAB — CBC WITH DIFFERENTIAL/PLATELET
Abs Immature Granulocytes: 0.04 10*3/uL (ref 0.00–0.07)
Basophils Absolute: 0.1 10*3/uL (ref 0.0–0.1)
Basophils Relative: 1 %
Eosinophils Absolute: 0 10*3/uL (ref 0.0–0.5)
Eosinophils Relative: 1 %
HCT: 34.6 % — ABNORMAL LOW (ref 36.0–46.0)
Hemoglobin: 11 g/dL — ABNORMAL LOW (ref 12.0–15.0)
Immature Granulocytes: 1 %
Lymphocytes Relative: 18 %
Lymphs Abs: 1.5 10*3/uL (ref 0.7–4.0)
MCH: 24.8 pg — ABNORMAL LOW (ref 26.0–34.0)
MCHC: 31.8 g/dL (ref 30.0–36.0)
MCV: 77.9 fL — ABNORMAL LOW (ref 80.0–100.0)
Monocytes Absolute: 0.5 10*3/uL (ref 0.1–1.0)
Monocytes Relative: 6 %
Neutro Abs: 6.1 10*3/uL (ref 1.7–7.7)
Neutrophils Relative %: 73 %
Platelets: 189 10*3/uL (ref 150–400)
RBC: 4.44 MIL/uL (ref 3.87–5.11)
RDW: 13.4 % (ref 11.5–15.5)
WBC: 8.1 10*3/uL (ref 4.0–10.5)
nRBC: 0 % (ref 0.0–0.2)

## 2022-12-02 LAB — URINALYSIS, ROUTINE W REFLEX MICROSCOPIC
Bilirubin Urine: NEGATIVE
Glucose, UA: NEGATIVE mg/dL
Hgb urine dipstick: NEGATIVE
Ketones, ur: NEGATIVE mg/dL
Leukocytes,Ua: NEGATIVE
Nitrite: NEGATIVE
Protein, ur: 100 mg/dL — AB
Specific Gravity, Urine: 1.015 (ref 1.005–1.030)
pH: 6 (ref 5.0–8.0)

## 2022-12-02 LAB — PROTEIN / CREATININE RATIO, URINE
Creatinine, Urine: 166 mg/dL
Protein Creatinine Ratio: 0.52 mg/mg{Cre} — ABNORMAL HIGH (ref 0.00–0.15)
Total Protein, Urine: 86 mg/dL

## 2022-12-02 SURGERY — Surgical Case
Anesthesia: Spinal | Laterality: Bilateral

## 2022-12-02 MED ORDER — HYDRALAZINE HCL 20 MG/ML IJ SOLN: 10.0000 mg | INTRAMUSCULAR | Status: DC | PRN

## 2022-12-02 MED ORDER — LABETALOL HCL 5 MG/ML IV SOLN: 80.0000 mg | INTRAVENOUS | Status: DC | PRN

## 2022-12-02 MED ORDER — POVIDONE-IODINE 10 % EX SWAB
2.0000 | Freq: Once | CUTANEOUS | Status: AC
  Administered 2022-12-02: 2 via TOPICAL

## 2022-12-02 MED ORDER — DIPHENHYDRAMINE HCL 25 MG PO CAPS: 25.0000 mg | ORAL_CAPSULE | Freq: Four times a day (QID) | ORAL | Status: DC | PRN

## 2022-12-02 MED ORDER — KETOROLAC TROMETHAMINE 30 MG/ML IJ SOLN
INTRAMUSCULAR | Status: AC
  Filled 2022-12-02: qty 1

## 2022-12-02 MED ORDER — NALOXONE HCL 0.4 MG/ML IJ SOLN: 0.4000 mg | INTRAMUSCULAR | Status: DC | PRN

## 2022-12-02 MED ORDER — LABETALOL HCL 5 MG/ML IV SOLN
INTRAVENOUS | Status: AC
  Administered 2022-12-02: 20 mg via INTRAVENOUS
  Filled 2022-12-02: qty 4

## 2022-12-02 MED ORDER — SCOPOLAMINE 1 MG/3DAYS TD PT72
1.0000 | MEDICATED_PATCH | Freq: Once | TRANSDERMAL | Status: DC
  Administered 2022-12-02: 1.5 mg via TRANSDERMAL

## 2022-12-02 MED ORDER — MAGNESIUM SULFATE BOLUS VIA INFUSION: 4.0000 g | Freq: Once | INTRAVENOUS | Status: DC

## 2022-12-02 MED ORDER — LABETALOL HCL 5 MG/ML IV SOLN: 40.0000 mg | INTRAVENOUS | Status: DC | PRN

## 2022-12-02 MED ORDER — OXYTOCIN-SODIUM CHLORIDE 30-0.9 UT/500ML-% IV SOLN: 2.5000 [IU]/h | INTRAVENOUS | Status: AC

## 2022-12-02 MED ORDER — ACETAMINOPHEN 500 MG PO TABS
1000.0000 mg | ORAL_TABLET | Freq: Four times a day (QID) | ORAL | Status: DC
  Administered 2022-12-02 – 2022-12-04 (×7): 1000 mg via ORAL
  Filled 2022-12-02 (×8): qty 2

## 2022-12-02 MED ORDER — COCONUT OIL OIL: 1.0000 | TOPICAL_OIL | Status: DC | PRN

## 2022-12-02 MED ORDER — ACETAMINOPHEN 500 MG PO TABS
1000.0000 mg | ORAL_TABLET | Freq: Once | ORAL | Status: AC
  Administered 2022-12-02: 1000 mg via ORAL
  Filled 2022-12-02: qty 2

## 2022-12-02 MED ORDER — DEXAMETHASONE SODIUM PHOSPHATE 10 MG/ML IJ SOLN
INTRAMUSCULAR | Status: DC | PRN
  Administered 2022-12-02: 10 mg via INTRAVENOUS

## 2022-12-02 MED ORDER — NIFEDIPINE ER OSMOTIC RELEASE 30 MG PO TB24
30.0000 mg | ORAL_TABLET | Freq: Every day | ORAL | Status: DC
  Administered 2022-12-02 – 2022-12-04 (×3): 30 mg via ORAL
  Filled 2022-12-02 (×3): qty 1

## 2022-12-02 MED ORDER — OXYCODONE HCL 5 MG PO TABS: 5.0000 mg | ORAL_TABLET | ORAL | Status: DC | PRN

## 2022-12-02 MED ORDER — MENTHOL 3 MG MT LOZG: 1.0000 | LOZENGE | OROMUCOSAL | Status: DC | PRN

## 2022-12-02 MED ORDER — FENTANYL CITRATE (PF) 100 MCG/2ML IJ SOLN: 25.0000 ug | INTRAMUSCULAR | Status: DC | PRN

## 2022-12-02 MED ORDER — ONDANSETRON HCL 4 MG/2ML IJ SOLN
INTRAMUSCULAR | Status: DC | PRN
  Administered 2022-12-02: 4 mg via INTRAVENOUS

## 2022-12-02 MED ORDER — SENNOSIDES-DOCUSATE SODIUM 8.6-50 MG PO TABS
2.0000 | ORAL_TABLET | Freq: Every day | ORAL | Status: DC
  Administered 2022-12-03 – 2022-12-04 (×2): 2 via ORAL
  Filled 2022-12-02: qty 2

## 2022-12-02 MED ORDER — OXYTOCIN-SODIUM CHLORIDE 30-0.9 UT/500ML-% IV SOLN
INTRAVENOUS | Status: DC | PRN
  Administered 2022-12-02 (×2): 30 [IU] via INTRAVENOUS

## 2022-12-02 MED ORDER — LACTATED RINGERS IV SOLN: INTRAVENOUS | Status: DC

## 2022-12-02 MED ORDER — NALOXONE HCL 4 MG/10ML IJ SOLN: 1.0000 ug/kg/h | INTRAVENOUS | Status: DC | PRN

## 2022-12-02 MED ORDER — SODIUM CHLORIDE 0.9% FLUSH: 3.0000 mL | INTRAVENOUS | Status: DC | PRN

## 2022-12-02 MED ORDER — LABETALOL HCL 5 MG/ML IV SOLN
40.0000 mg | INTRAVENOUS | Status: DC | PRN
  Administered 2022-12-02: 40 mg via INTRAVENOUS
  Filled 2022-12-02: qty 8

## 2022-12-02 MED ORDER — ONDANSETRON HCL 4 MG/2ML IJ SOLN
INTRAMUSCULAR | Status: AC
  Filled 2022-12-02: qty 2

## 2022-12-02 MED ORDER — MAGNESIUM SULFATE 40 GM/1000ML IV SOLN
2.0000 g/h | INTRAVENOUS | Status: AC
  Administered 2022-12-02 – 2022-12-03 (×2): 2 g/h via INTRAVENOUS
  Filled 2022-12-02: qty 1000

## 2022-12-02 MED ORDER — OXYCODONE HCL 5 MG/5ML PO SOLN: 5.0000 mg | Freq: Once | ORAL | Status: DC | PRN

## 2022-12-02 MED ORDER — SCOPOLAMINE 1 MG/3DAYS TD PT72
MEDICATED_PATCH | TRANSDERMAL | Status: AC
  Filled 2022-12-02: qty 1

## 2022-12-02 MED ORDER — KETOROLAC TROMETHAMINE 30 MG/ML IJ SOLN
30.0000 mg | Freq: Once | INTRAMUSCULAR | Status: AC
  Administered 2022-12-02: 30 mg via INTRAVENOUS

## 2022-12-02 MED ORDER — MEPERIDINE HCL 25 MG/ML IJ SOLN: 6.2500 mg | INTRAMUSCULAR | Status: DC | PRN

## 2022-12-02 MED ORDER — SOD CITRATE-CITRIC ACID 500-334 MG/5ML PO SOLN
30.0000 mL | Freq: Once | ORAL | Status: AC
  Administered 2022-12-02: 30 mL via ORAL
  Filled 2022-12-02: qty 30

## 2022-12-02 MED ORDER — MAGNESIUM SULFATE 40 GM/1000ML IV SOLN
INTRAVENOUS | Status: AC
  Administered 2022-12-02: 4 g via INTRAVENOUS
  Filled 2022-12-02: qty 1000

## 2022-12-02 MED ORDER — AMISULPRIDE (ANTIEMETIC) 5 MG/2ML IV SOLN: 10.0000 mg | Freq: Once | INTRAVENOUS | Status: DC | PRN

## 2022-12-02 MED ORDER — PHENYLEPHRINE HCL-NACL 20-0.9 MG/250ML-% IV SOLN
INTRAVENOUS | Status: DC | PRN
  Administered 2022-12-02: 60 ug/min via INTRAVENOUS

## 2022-12-02 MED ORDER — WITCH HAZEL-GLYCERIN EX PADS: 1.0000 | MEDICATED_PAD | CUTANEOUS | Status: DC | PRN

## 2022-12-02 MED ORDER — FAMOTIDINE 20 MG PO TABS
20.0000 mg | ORAL_TABLET | Freq: Once | ORAL | Status: AC
  Administered 2022-12-02: 20 mg via ORAL
  Filled 2022-12-02: qty 1

## 2022-12-02 MED ORDER — OXYCODONE HCL 5 MG PO TABS: 5.0000 mg | ORAL_TABLET | Freq: Once | ORAL | Status: DC | PRN

## 2022-12-02 MED ORDER — LABETALOL HCL 5 MG/ML IV SOLN
80.0000 mg | INTRAVENOUS | Status: DC | PRN
  Administered 2022-12-02: 80 mg via INTRAVENOUS
  Filled 2022-12-02: qty 16

## 2022-12-02 MED ORDER — FENTANYL CITRATE (PF) 100 MCG/2ML IJ SOLN
INTRAMUSCULAR | Status: AC
  Filled 2022-12-02: qty 2

## 2022-12-02 MED ORDER — MORPHINE SULFATE (PF) 0.5 MG/ML IJ SOLN
INTRAMUSCULAR | Status: AC
  Filled 2022-12-02: qty 10

## 2022-12-02 MED ORDER — LABETALOL HCL 5 MG/ML IV SOLN: 20.0000 mg | Freq: Once | INTRAVENOUS | Status: AC

## 2022-12-02 MED ORDER — PRENATAL MULTIVITAMIN CH
1.0000 | ORAL_TABLET | Freq: Every day | ORAL | Status: DC
  Administered 2022-12-03 – 2022-12-04 (×2): 1 via ORAL
  Filled 2022-12-02 (×2): qty 1

## 2022-12-02 MED ORDER — ONDANSETRON HCL 4 MG/2ML IJ SOLN: 4.0000 mg | Freq: Three times a day (TID) | INTRAMUSCULAR | Status: DC | PRN

## 2022-12-02 MED ORDER — MAGNESIUM SULFATE BOLUS VIA INFUSION
4.0000 g | Freq: Once | INTRAVENOUS | Status: AC
  Filled 2022-12-02: qty 1000

## 2022-12-02 MED ORDER — IBUPROFEN 600 MG PO TABS
600.0000 mg | ORAL_TABLET | Freq: Four times a day (QID) | ORAL | Status: DC
  Administered 2022-12-02 – 2022-12-04 (×8): 600 mg via ORAL
  Filled 2022-12-02 (×8): qty 1

## 2022-12-02 MED ORDER — DIPHENHYDRAMINE HCL 50 MG/ML IJ SOLN: 12.5000 mg | Freq: Four times a day (QID) | INTRAMUSCULAR | Status: DC | PRN

## 2022-12-02 MED ORDER — LABETALOL HCL 5 MG/ML IV SOLN: 20.0000 mg | INTRAVENOUS | Status: DC | PRN

## 2022-12-02 MED ORDER — BUPIVACAINE IN DEXTROSE 0.75-8.25 % IT SOLN
INTRATHECAL | Status: DC | PRN
  Administered 2022-12-02: 1.6 mL via INTRATHECAL

## 2022-12-02 MED ORDER — SIMETHICONE 80 MG PO CHEW
80.0000 mg | CHEWABLE_TABLET | Freq: Three times a day (TID) | ORAL | Status: DC
  Administered 2022-12-03 – 2022-12-04 (×5): 80 mg via ORAL
  Filled 2022-12-02 (×6): qty 1

## 2022-12-02 MED ORDER — ACETAMINOPHEN 10 MG/ML IV SOLN: 1000.0000 mg | Freq: Once | INTRAVENOUS | Status: DC | PRN

## 2022-12-02 MED ORDER — MAGNESIUM SULFATE 40 GM/1000ML IV SOLN: 2.0000 g/h | INTRAVENOUS | Status: DC

## 2022-12-02 MED ORDER — CEFAZOLIN SODIUM-DEXTROSE 2-4 GM/100ML-% IV SOLN
2.0000 g | Freq: Once | INTRAVENOUS | Status: AC
  Administered 2022-12-02: 2 g via INTRAVENOUS
  Filled 2022-12-02: qty 100

## 2022-12-02 MED ORDER — PROMETHAZINE HCL 25 MG/ML IJ SOLN: 6.2500 mg | INTRAMUSCULAR | Status: DC | PRN

## 2022-12-02 MED ORDER — SIMETHICONE 80 MG PO CHEW: 80.0000 mg | CHEWABLE_TABLET | ORAL | Status: DC | PRN

## 2022-12-02 MED ORDER — FENTANYL INJECTION ORDERABLE
Status: DC | PRN
  Administered 2022-12-02: 15 ug via INTRATHECAL

## 2022-12-02 MED ORDER — DEXAMETHASONE SODIUM PHOSPHATE 10 MG/ML IJ SOLN
INTRAMUSCULAR | Status: AC
  Filled 2022-12-02: qty 1

## 2022-12-02 MED ORDER — DIBUCAINE (PERIANAL) 1 % EX OINT: 1.0000 | TOPICAL_OINTMENT | CUTANEOUS | Status: DC | PRN

## 2022-12-02 MED ORDER — MORPHINE SULFATE (PF) 0.5 MG/ML IJ SOLN
INTRAMUSCULAR | Status: DC | PRN
  Administered 2022-12-02: 150 ug via INTRATHECAL

## 2022-12-02 SURGICAL SUPPLY — 37 items
ADH SKN CLS APL DERMABOND .7 (GAUZE/BANDAGES/DRESSINGS) ×1
APL PRP STRL LF DISP 70% ISPRP (MISCELLANEOUS) ×2
APL SKNCLS STERI-STRIP NONHPOA (GAUZE/BANDAGES/DRESSINGS) ×1
BENZOIN TINCTURE PRP APPL 2/3 (GAUZE/BANDAGES/DRESSINGS) ×2 IMPLANT
CHLORAPREP W/TINT 26 (MISCELLANEOUS) ×4 IMPLANT
CLAMP UMBILICAL CORD (MISCELLANEOUS) ×2 IMPLANT
CLOTH BEACON ORANGE TIMEOUT ST (SAFETY) ×2 IMPLANT
DERMABOND ADVANCED .7 DNX12 (GAUZE/BANDAGES/DRESSINGS) IMPLANT
DRSG OPSITE POSTOP 4X10 (GAUZE/BANDAGES/DRESSINGS) ×2 IMPLANT
ELECT REM PT RETURN 9FT ADLT (ELECTROSURGICAL) ×1
ELECTRODE REM PT RTRN 9FT ADLT (ELECTROSURGICAL) ×2 IMPLANT
EXTRACTOR VACUUM KIWI (MISCELLANEOUS) IMPLANT
GLOVE BIOGEL M STER SZ 6 (GLOVE) ×2 IMPLANT
GLOVE BIOGEL PI IND STRL 6 (GLOVE) ×2 IMPLANT
GLOVE BIOGEL PI IND STRL 7.0 (GLOVE) ×2 IMPLANT
GOWN STRL REUS W/TWL LRG LVL3 (GOWN DISPOSABLE) ×4 IMPLANT
KIT ABG SYR 3ML LUER SLIP (SYRINGE) IMPLANT
NDL HYPO 25X5/8 SAFETYGLIDE (NEEDLE) IMPLANT
NEEDLE HYPO 25X5/8 SAFETYGLIDE (NEEDLE) IMPLANT
NS IRRIG 1000ML POUR BTL (IV SOLUTION) ×2 IMPLANT
PACK C SECTION WH (CUSTOM PROCEDURE TRAY) ×2 IMPLANT
PAD OB MATERNITY 4.3X12.25 (PERSONAL CARE ITEMS) ×2 IMPLANT
RTRCTR C-SECT PINK 25CM LRG (MISCELLANEOUS) ×2 IMPLANT
STRIP CLOSURE SKIN 1/2X4 (GAUZE/BANDAGES/DRESSINGS) ×2 IMPLANT
SUT MNCRL 0 VIOLET CTX 36 (SUTURE) ×4 IMPLANT
SUT MONOCRYL 0 CTX 36 (SUTURE) ×2
SUT PLAIN 0 NONE (SUTURE) IMPLANT
SUT PLAIN 2 0 (SUTURE) ×1
SUT PLAIN ABS 2-0 CT1 27XMFL (SUTURE) ×2 IMPLANT
SUT VIC AB 0 CTX 36 (SUTURE) ×2
SUT VIC AB 0 CTX36XBRD ANBCTRL (SUTURE) ×4 IMPLANT
SUT VIC AB 2-0 CT1 27 (SUTURE) ×1
SUT VIC AB 2-0 CT1 TAPERPNT 27 (SUTURE) ×2 IMPLANT
SUT VICRYL 4-0 PS2 18IN ABS (SUTURE) ×2 IMPLANT
TOWEL OR 17X24 6PK STRL BLUE (TOWEL DISPOSABLE) ×2 IMPLANT
TRAY FOLEY W/BAG SLVR 14FR LF (SET/KITS/TRAYS/PACK) ×2 IMPLANT
WATER STERILE IRR 1000ML POUR (IV SOLUTION) ×2 IMPLANT

## 2022-12-02 NOTE — Transfer of Care (Signed)
Immediate Anesthesia Transfer of Care Note  Patient: GRISELDA BRAMBLETT  Procedure(s) Performed: CESAREAN SECTION (Bilateral)  Patient Location: PACU  Anesthesia Type:Spinal  Level of Consciousness: awake, alert , and patient cooperative  Airway & Oxygen Therapy: Patient Spontanous Breathing  Post-op Assessment: Report given to RN and Post -op Vital signs reviewed and stable  Post vital signs: Reviewed and stable  Last Vitals:  Vitals Value Taken Time  BP 117/76   Temp    Pulse 62 12/02/22 1905  Resp 14 12/02/22 1905  SpO2 97 % 12/02/22 1905  Vitals shown include unvalidated device data.  Last Pain:  Vitals:   12/02/22 1600  TempSrc: Oral  PainSc:          Complications: No notable events documented.

## 2022-12-02 NOTE — MAU Note (Signed)
.  Marissa Stevenson is a 36 y.o. at [redacted]w[redacted]d here in MAU reporting: elevated BP at home 160s/90s and a headache since this AM. Pt. Reports seeing cardiologist yesterday for [facility] in third trimester and having SRBPs in office. Pt. Was started on nifedipine 30mg  yesterday by cardiologist. first dose last night. Has not taken anything for HA this AM. G1 was c/s for fetal intolerance. Does not desire VBAC if induced. Reports positive FM, denies any LOF or VB.  LMP: unknown Onset of complaint: 12/02/2022 this AM Pain score: 8/10- headache Vitals:   12/02/22 1155  BP: (!) 165/94  Pulse: 82  Resp: 20  Temp: (!) 97.5 F (36.4 C)  SpO2: 100%     FHT:140s Lab orders placed from triage: UA
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
## 2022-12-02 NOTE — Progress Notes (Signed)
PACU care complete. Attempted to transfer to [facility] but was informed the the nurse receiving the patient is not here yet. Awaiting a call back.

## 2022-12-02 NOTE — Op Note (Signed)
Operative Report   Pre-Operative Diagnosis: 1) 36+3-week intrauterine pregnancy 2) severe preeclampsia 3) desired repeat cesarean section  Postoperative Diagnosis: 1) 36+3-week intrauterine pregnancy 2) severe preeclampsia 3) desired repeat cesarean section  Procedure: Repeat low-transverse cesarean section  Surgeon: Dr. Waynard Reeds  Assistant: Lavonda Jumbo, DO   Antibiotics: Ancef 2 g  Operative Findings: Vigorous female infant in the vertex presentation with Apgar scores of 9 at 1 minute and 9 at 5 minutes.  Normal-appearing ovaries and tubes.  Specimen: Placenta to pathology EBL: No intake/output data recorded.    Procedure:Marissa Stevenson is an 36 year old gravida 2 para 1001 at 91 weeks and 3 days estimated gestational age who presents for cesarean section. Following the appropriate informed consent the patient was brought to the operating room where spinal anesthesia was administered and found to be adequate. She was placed in the dorsal supine position with a leftward tilt. She was prepped and draped in the normal sterile fashion.  The patient was appropriately identified during a preoperative timeout procedure.  The scalpel was then used to make a Pfannenstiel skin incision which was carried down to the underlying layers of soft tissue to the fascia. The fascia was incised in the midline and the fascial incision was extended laterally with Mayo scissors. The superior aspect of the fascial incision was grasped with Coker clamps x2, tented up and the rectus muscles dissected off sharply with the electrocautery unit. The same procedure was repeated on the inferior aspect of the fascial incision. The rectus muscles were separated in the midline. The abdominal peritoneum was identified, tented up, entered sharply, and the incision was extended superiorly and inferiorly with good visualization of the bladder. The Alexis retractor was then deployed. The vesicouterine peritoneum was  identified, tented up, entered sharply, and the bladder flap was created digitally.  The scalpel was then used to make a low transverse incision on the uterus which was extended laterally with blunt dissection. The fetal vertex was identified, delivered easily through the uterine incision followed by the body. The infant was bulb suctioned on the operative field and cried vigorously.  Following a 1 minute delay, the cord was clamped and cut. The infant was passed to the waiting neonatology team. Placenta was then delivered spontaneously and the uterus was cleared of all clot and debris. The uterine incision was repaired with #1 chromic in running locked fashion followed by a second imbricating layer. The ovaries and tubes were inspected and normal. The Alexis retractor was removed. The abdominal peritoneum was reapproximated with 2-0 Vicryl in a running fashion. The rectus muscles was reapproximated with 2-0 chromic in a running fashion. The fascia was closed with 0-looped PDS in a running fashion. The skin was closed with 4-0 vicryl in a subcuticular fashion and Dermabond. All laparotomy sponge, instrument, and needle counts were correct.  The patient tolerated the procedure well and was transferred to the recovery unit in stable condition following the procedure.

## 2022-12-02 NOTE — Anesthesia Procedure Notes (Signed)
Spinal  Patient location during procedure: OR Start time: 12/02/2022 5:40 PM End time: 12/02/2022 5:47 PM Reason for block: surgical anesthesia Staffing Performed: anesthesiologist  Anesthesiologist: Leonides Grills, MD Performed by: Leonides Grills, MD Authorized by: Leonides Grills, MD   Preanesthetic Checklist Completed: patient identified, IV checked, risks and benefits discussed, surgical consent, monitors and equipment checked, pre-op evaluation and timeout performed Spinal Block Patient position: sitting Prep: DuraPrep Patient monitoring: cardiac monitor, continuous pulse ox and blood pressure Approach: midline Location: L4-5 Injection technique: single-shot Needle Needle type: Pencan  Needle gauge: 24 G Needle length: 9 cm Assessment Sensory level: T10 Events: CSF return Additional Notes Functioning IV was confirmed and monitors were applied. Sterile prep and drape, including hand hygiene and sterile gloves were used. The patient was positioned and the spine was prepped. The skin was anesthetized with lidocaine.  Free flow of clear CSF was obtained prior to injecting local anesthetic into the CSF.  The spinal needle aspirated freely following injection.  The needle was carefully withdrawn.  The patient tolerated the procedure well.
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
## 2022-12-02 NOTE — Anesthesia Preprocedure Evaluation (Addendum)
Anesthesia Evaluation  Patient identified by MRN, date of birth, ID band Patient awake    Reviewed: Allergy & Precautions, NPO status , Patient's Chart, lab work & pertinent test results  Airway Mallampati: II  TM Distance: >3 FB Neck ROM: Full    Dental no notable dental hx.    Pulmonary neg pulmonary ROS   Pulmonary exam normal        Cardiovascular negative cardio ROS Normal cardiovascular exam     Neuro/Psych  Headaches  negative psych ROS   GI/Hepatic negative GI ROS, Neg liver ROS,,,  Endo/Other  negative endocrine ROS    Renal/GU negative Renal ROS     Musculoskeletal negative musculoskeletal ROS (+)    Abdominal   Peds  Hematology  (+) Blood dyscrasia, anemia   Anesthesia Other Findings REPEAT CESAREAN SECTION  Reproductive/Obstetrics                             Anesthesia Physical Anesthesia Plan  ASA: 2  Anesthesia Plan: Spinal   Post-op Pain Management:    Induction: Intravenous  PONV Risk Score and Plan: 2 and Ondansetron, Dexamethasone and Treatment may vary due to age or medical condition  Airway Management Planned: Natural Airway  Additional Equipment:   Intra-op Plan:   Post-operative Plan:   Informed Consent: I have reviewed the patients History and Physical, chart, labs and discussed the procedure including the risks, benefits and alternatives for the proposed anesthesia with the patient or authorized representative who has indicated his/her understanding and acceptance.     Dental advisory given  Plan Discussed with: CRNA  Anesthesia Plan Comments:        Anesthesia Quick Evaluation

## 2022-12-03 LAB — CBC
HCT: 26.1 % — ABNORMAL LOW (ref 36.0–46.0)
Hemoglobin: 8.3 g/dL — ABNORMAL LOW (ref 12.0–15.0)
MCH: 24.9 pg — ABNORMAL LOW (ref 26.0–34.0)
MCHC: 31.8 g/dL (ref 30.0–36.0)
MCV: 78.1 fL — ABNORMAL LOW (ref 80.0–100.0)
Platelets: 180 10*3/uL (ref 150–400)
RBC: 3.34 MIL/uL — ABNORMAL LOW (ref 3.87–5.11)
RDW: 13.6 % (ref 11.5–15.5)
WBC: 15.1 10*3/uL — ABNORMAL HIGH (ref 4.0–10.5)
nRBC: 0 % (ref 0.0–0.2)

## 2022-12-03 LAB — COMPREHENSIVE METABOLIC PANEL
ALT: 11 U/L (ref 0–44)
AST: 26 U/L (ref 15–41)
Albumin: 2.1 g/dL — ABNORMAL LOW (ref 3.5–5.0)
Alkaline Phosphatase: 131 U/L — ABNORMAL HIGH (ref 38–126)
Anion gap: 12 (ref 5–15)
BUN: 5 mg/dL — ABNORMAL LOW (ref 6–20)
CO2: 17 mmol/L — ABNORMAL LOW (ref 22–32)
Calcium: 7.1 mg/dL — ABNORMAL LOW (ref 8.9–10.3)
Chloride: 103 mmol/L (ref 98–111)
Creatinine, Ser: 0.75 mg/dL (ref 0.44–1.00)
GFR, Estimated: >60 mL/min (ref >60)
Glucose, Bld: 132 mg/dL — ABNORMAL HIGH (ref 70–99)
Potassium: 4.3 mmol/L (ref 3.5–5.1)
Sodium: 132 mmol/L — ABNORMAL LOW (ref 135–145)
Total Bilirubin: 0.4 mg/dL (ref 0.3–1.2)
Total Protein: 5.1 g/dL — ABNORMAL LOW (ref 6.5–8.1)

## 2022-12-03 LAB — MAGNESIUM: Magnesium: 6.4 mg/dL (ref 1.7–2.4)

## 2022-12-03 MED ORDER — AMMONIA AROMATIC IN INHA
RESPIRATORY_TRACT | Status: AC
  Filled 2022-12-03: qty 10

## 2022-12-03 MED ORDER — CALCIUM GLUCONATE-NACL 1-0.675 GM/50ML-% IV SOLN
1.0000 g | Freq: Once | INTRAVENOUS | Status: AC
  Administered 2022-12-03: 1000 mg via INTRAVENOUS
  Filled 2022-12-03: qty 50

## 2022-12-03 NOTE — Plan of Care (Signed)
  Problem: Education: Goal: Knowledge of the prescribed therapeutic regimen will improve Outcome: Progressing   Problem: Fluid Volume: Goal: Peripheral tissue perfusion will improve Outcome: Progressing   Problem: Clinical Measurements: Goal: Complications related to disease process, condition or treatment will be avoided or minimized Outcome: Progressing   Problem: Health Behavior/Discharge Planning: Goal: Ability to manage health-related needs will improve Outcome: Progressing   Problem: Clinical Measurements: Goal: Ability to maintain clinical measurements within normal limits will improve Outcome: Progressing Goal: Will remain free from infection Outcome: Progressing Goal: Diagnostic test results will improve Outcome: Progressing Goal: Respiratory complications will improve Outcome: Progressing Goal: Cardiovascular complication will be avoided Outcome: Progressing   Problem: Activity: Goal: Risk for activity intolerance will decrease Outcome: Progressing   Problem: Coping: Goal: Level of anxiety will decrease Outcome: Progressing   Problem: Elimination: Goal: Will not experience complications related to bowel motility Outcome: Progressing Goal: Will not experience complications related to urinary retention Outcome: Progressing   Problem: Pain Managment: Goal: General experience of comfort will improve Outcome: Progressing   Problem: Safety: Goal: Ability to remain free from injury will improve Outcome: Progressing   Problem: Skin Integrity: Goal: Risk for impaired skin integrity will decrease Outcome: Progressing   Problem: Education: Goal: Knowledge of the prescribed therapeutic regimen will improve Outcome: Progressing Goal: Understanding of sexual limitations or changes related to disease process or condition will improve Outcome: Progressing Goal: Individualized Educational Video(s) Outcome: Progressing   Problem: Self-Concept: Goal:  Communication of feelings regarding changes in body function or appearance will improve Outcome: Progressing   Problem: Skin Integrity: Goal: Demonstration of wound healing without infection will improve Outcome: Progressing   Problem: Education: Goal: Knowledge of condition will improve Outcome: Progressing Goal: Individualized Educational Video(s) Outcome: Progressing Goal: Individualized Newborn Educational Video(s) Outcome: Progressing   Problem: Activity: Goal: Will verbalize the importance of balancing activity with adequate rest periods Outcome: Progressing Goal: Ability to tolerate increased activity will improve Outcome: Progressing   Problem: Coping: Goal: Ability to identify and utilize available resources and services will improve Outcome: Progressing   Problem: Life Cycle: Goal: Chance of risk for complications during the postpartum period will decrease Outcome: Progressing   Problem: Role Relationship: Goal: Ability to demonstrate positive interaction with newborn will improve Outcome: Progressing   Problem: Skin Integrity: Goal: Demonstration of wound healing without infection will improve Outcome: Progressing

## 2022-12-03 NOTE — Lactation Note (Signed)
This note was copied from a baby's chart. Lactation Consultation Note  Patient Name: Marissa Stevenson [id number] Date: 12/03/2022 Reason for consult: Initial assessment;Late-preterm 34-36.6wks Age:36 hours  LC In to visit with P2 Mom of LPTI delivered by C/Section.  Baby delivered at [redacted]w[redacted]d weighing >6lbs.  Mom has a history of low milk supply with her first baby, she ended up formula feeding after 3 months as baby wasn't gaining well.  Mom reports + breast changes with pregnancy.  Mom reports baby is latching to the breast well for about 10 mins. Mom does notice baby being sleepier.  Crib card filled out and Mom to supplement per volume guidelines. Baby has been fed 22 cal Neosure formula and baby took 12 ml last two feedings.  Encouraged STS as much as possible.  Mom is not sure about pumping, but LC set it up for when baby becomes too sleepy to latch and feed.  Reviewed normal LPTI feeding behavior and how baby will be more sleepy in the next few days.  Plan recommended- 1- Keep baby STS as much as possible 2- At least every 3 hrs, offer the breast if baby is showing feeding cue 3- supplement per volume guidelines 4-Pump both breasts on initiation setting for 15 mins.  Mom encouraged to ask for help prn.  Maternal Data Has patient been taught Hand Expression?: No Does the patient have breastfeeding experience prior to this delivery?: Yes How long did the patient breastfeed?: 3 months  Feeding Mother's Current Feeding Choice: Breast Milk and Formula Nipple Type: Extra Slow Flow   Lactation Tools Discussed/Used Tools: Pump;Flanges;Bottle Flange Size: 21 Breast pump type: Double-Electric Breast Pump Pump Education: Setup, frequency, and cleaning;Milk Storage Reason for Pumping: Support milk supply/LPTI/history of low milk supply Pumping frequency: Encouraged pumping 15 mins after every breastfeed Pumped volume: 0 mL (Mom has not pumped yet)  Interventions Interventions:  Breast feeding basics reviewed;Skin to skin;Breast massage;Hand express;DEBP;LC Services brochure  Discharge Pump: DEBP (Medela DEBP)  Consult Status Consult Status: Follow-up Date: 12/04/22 Follow-up type: In-patient    Judee Clara 12/03/2022, 11:47 AM

## 2022-12-03 NOTE — Anesthesia Postprocedure Evaluation (Signed)
Anesthesia Post Note  Patient: Marissa Stevenson  Procedure(s) Performed: CESAREAN SECTION (Bilateral)     Patient location during evaluation: PACU Anesthesia Type: Spinal Level of consciousness: awake Pain management: pain level controlled Vital Signs Assessment: post-procedure vital signs reviewed and stable Respiratory status: spontaneous breathing, nonlabored ventilation and respiratory function stable Cardiovascular status: blood pressure returned to baseline and stable Postop Assessment: no apparent nausea or vomiting Anesthetic complications: no   No notable events documented.  Last Vitals:  Vitals:   12/02/22 2300 12/03/22 0001  BP: 124/67 (!) 147/75  Pulse: (!) 54 (!) 56  Resp: 16 16  Temp:  36.4 C  SpO2:      Last Pain:  Vitals:   12/03/22 0001  TempSrc: Oral  PainSc:    Pain Goal:                   Catheryn Bacon Kaydee Magel

## 2022-12-03 NOTE — Progress Notes (Signed)
Subjective: Postpartum Day 1: Cesarean Delivery Patient reports patient feeling "drunk" and loopy with magnesium. Pain controlled. Tolerating PO. Foley still in due to pre-syncopal episode when trying to get up to bathroom.   Objective: Vital signs in last 24 hours: Temp:  [96.4 F (35.8 C)-98 F (36.7 C)] 97.5 F (36.4 C) (12/24 1231) Pulse Rate:  [50-81] 63 (12/24 1231) Resp:  [12-18] 15 (12/24 1231) BP: (106-148)/(61-88) 130/81 (12/24 1231) SpO2:  [94 %-100 %] 98 % (12/24 1230) Vitals:   12/03/22 1000 12/03/22 1054 12/03/22 1230 12/03/22 1231  BP:    130/81  Pulse:    63  Resp: 15 16  15   Temp:    (!) 97.5 F (36.4 C)  TempSrc:    Oral  SpO2:   98%   Weight:      Height:          Physical Exam:  General: alert, cooperative, and appears stated age 36: appropriate Uterine Fundus: firm Incision: healing well DVT Evaluation: No evidence of DVT seen on physical exam.  Recent Labs    12/02/22 1229 12/03/22 0429  HGB 11.0* 8.3*  HCT 34.6* 26.1*   Results for orders placed or performed during the hospital encounter of 12/02/22 (from the past 24 hour(s))  CBC     Status: Abnormal   Collection Time: 12/03/22  4:29 AM  Result Value Ref Range   WBC 15.1 (H) 4.0 - 10.5 K/uL   RBC 3.34 (L) 3.87 - 5.11 MIL/uL   Hemoglobin 8.3 (L) 12.0 - 15.0 g/dL   HCT 12/05/22 (L) 67.6 - 72.0 %   MCV 78.1 (L) 80.0 - 100.0 fL   MCH 24.9 (L) 26.0 - 34.0 pg   MCHC 31.8 30.0 - 36.0 g/dL   RDW 94.7 09.6 - 28.3 %   Platelets 180 150 - 400 K/uL   nRBC 0.0 0.0 - 0.2 %  Magnesium     Status: Abnormal   Collection Time: 12/03/22  4:29 AM  Result Value Ref Range   Magnesium 6.4 (HH) 1.7 - 2.4 mg/dL  Comprehensive metabolic panel     Status: Abnormal   Collection Time: 12/03/22  4:29 AM  Result Value Ref Range   Sodium 132 (L) 135 - 145 mmol/L   Potassium 4.3 3.5 - 5.1 mmol/L   Chloride 103 98 - 111 mmol/L   CO2 17 (L) 22 - 32 mmol/L   Glucose, Bld 132 (H) 70 - 99 mg/dL   BUN 5 (L) 6 -  20 mg/dL   Creatinine, Ser 12/05/22 0.44 - 1.00 mg/dL   Calcium 7.1 (L) 8.9 - 10.3 mg/dL   Total Protein 5.1 (L) 6.5 - 8.1 g/dL   Albumin 2.1 (L) 3.5 - 5.0 g/dL   AST 26 15 - 41 U/L   ALT 11 0 - 44 U/L   Alkaline Phosphatase 131 (H) 38 - 126 U/L   Total Bilirubin 0.4 0.3 - 1.2 mg/dL   GFR, Estimated 9.47 >65 mL/min   Anion gap 12 5 - 15     Assessment/Plan: Status post Cesarean section. Doing well postoperatively.  Continue current care. D/C Magnesium @ 17:30, Mag level therapeutic Hgb 11-->8.3, ABLA, continue PNV Calcium low, 1 gm Ca2+gluconate given BPs normotensive now. Procardia 30mg  XL continued, will monitor BPs after magnesium discontinued to see if increase   >46, MD 12/03/2022, 1:43 PM

## 2022-12-04 LAB — COMPREHENSIVE METABOLIC PANEL
ALT: 10 U/L (ref 0–44)
AST: 22 U/L (ref 15–41)
Albumin: 2.3 g/dL — ABNORMAL LOW (ref 3.5–5.0)
Alkaline Phosphatase: 112 U/L (ref 38–126)
Anion gap: 9 (ref 5–15)
BUN: 10 mg/dL (ref 6–20)
CO2: 20 mmol/L — ABNORMAL LOW (ref 22–32)
Calcium: 7.3 mg/dL — ABNORMAL LOW (ref 8.9–10.3)
Chloride: 106 mmol/L (ref 98–111)
Creatinine, Ser: 0.74 mg/dL (ref 0.44–1.00)
GFR, Estimated: >60 mL/min (ref >60)
Glucose, Bld: 97 mg/dL (ref 70–99)
Potassium: 3.9 mmol/L (ref 3.5–5.1)
Sodium: 135 mmol/L (ref 135–145)
Total Bilirubin: 0.3 mg/dL (ref 0.3–1.2)
Total Protein: 5.3 g/dL — ABNORMAL LOW (ref 6.5–8.1)

## 2022-12-04 LAB — CBC
HCT: 23.6 % — ABNORMAL LOW (ref 36.0–46.0)
Hemoglobin: 7.5 g/dL — ABNORMAL LOW (ref 12.0–15.0)
MCH: 25 pg — ABNORMAL LOW (ref 26.0–34.0)
MCHC: 31.8 g/dL (ref 30.0–36.0)
MCV: 78.7 fL — ABNORMAL LOW (ref 80.0–100.0)
Platelets: 176 10*3/uL (ref 150–400)
RBC: 3 MIL/uL — ABNORMAL LOW (ref 3.87–5.11)
RDW: 14.1 % (ref 11.5–15.5)
WBC: 13.9 10*3/uL — ABNORMAL HIGH (ref 4.0–10.5)
nRBC: 0 % (ref 0.0–0.2)

## 2022-12-04 LAB — RPR: RPR Ser Ql: NONREACTIVE

## 2022-12-04 MED ORDER — NIFEDIPINE ER OSMOTIC RELEASE 30 MG PO TB24: 30.0000 mg | ORAL_TABLET | Freq: Every day | ORAL | 3 refills | Status: DC

## 2022-12-04 MED ORDER — OXYCODONE HCL 5 MG PO TABS: 5.0000 mg | ORAL_TABLET | ORAL | 0 refills | Status: DC | PRN

## 2022-12-04 NOTE — Discharge Summary (Signed)
Postpartum Discharge Summary  Date of Service updated      Patient Name: Marissa Stevenson DOB: 1986/09/17 MRN: [id number]  Date of admission: 12/02/2022 Delivery date:12/02/2022  Delivering provider: Vanessa Kick  Date of discharge: 12/04/2022  Admitting diagnosis: Severe pre-eclampsia [O14.10] Intrauterine pregnancy: [redacted]w[redacted]d    Secondary diagnosis:  Principal Problem:   Severe pre-eclampsia  Additional problems: AMA, prior c/s    Discharge diagnosis: Preterm Pregnancy Delivered and Preeclampsia (severe)                                              Post partum procedures: none Augmentation: N/A Complications: None  Hospital course: Sceduled C/S   36y.o. yo G2P1102 at 36w3das admitted to the hospital 12/02/2022 for UNscheduled cesarean section with the following indication:Elective Repeat with Severe preeclampsia at 36 3/7 weeks.  Delivery details are as follows:  Membrane Rupture Time/Date: 6:11 PM ,12/02/2022   Delivery Method:C-Section, Low Transverse  Details of operation can be found in separate operative note.  Patient had a postpartum course complicated bynone.  She is ambulating, tolerating a regular diet, passing flatus, and urinating well. Patient is discharged home in stable condition on  12/04/22        Newborn Data: Birth date:12/02/2022  Birth time:6:12 PM  Gender:Female  Living status:Living  Apgars:9 ,9  Weight:2920 g     Magnesium Sulfate received: Yes: Seizure prophylaxis BMZ received: No Rhophylac:No - baby was Rh neg. MMR:No T-DaP:Given prenatally Flu: No Transfusion:No  Physical exam  Vitals:   12/03/22 2323 12/04/22 0526 12/04/22 0757 12/04/22 1208  BP: (!) 146/82 (!) 141/68 130/79 137/77  Pulse: 74 78 80 84  Resp: _0 Temp: 97.7 F (36.5 C) 98.5 F (36.9 C) 98 F (36.7 C) 97.6 F (36.4 C)  TempSrc: Oral Oral Oral Oral  SpO2: 98% 98% 100% 98%  Weight:      Height:        Labs: Lab Results  Component Value Date   WBC  13.9 (H) 12/04/2022   HGB 7.5 (L) 12/04/2022   HCT 23.6 (L) 12/04/2022   MCV 78.7 (L) 12/04/2022   PLT 176 12/04/2022      Latest Ref Rng & Units 12/04/2022    6:01 AM  CMP  Glucose 70 - 99 mg/dL 97   BUN 6 - 20 mg/dL 10   Creatinine 0.44 - 1.00 mg/dL 0.74   Sodium 135 - 145 mmol/L 135   Potassium 3.5 - 5.1 mmol/L 3.9   Chloride 98 - 111 mmol/L 106   CO2 22 - 32 mmol/L 20   Calcium 8.9 - 10.3 mg/dL 7.3   Total Protein 6.5 - 8.1 g/dL 5.3   Total Bilirubin 0.3 - 1.2 mg/dL 0.3   Alkaline Phos 38 - 126 U/L 112   AST 15 - 41 U/L 22   ALT 0 - 44 U/L 10    Edinburgh Score:    12/03/2022    3:49 PM  Edinburgh Postnatal Depression Scale Screening Tool  I have been able to laugh and see the funny side of things. 0  I have looked forward with enjoyment to things. 0  I have blamed myself unnecessarily when things went wrong. 2  I have been anxious or worried for no good reason. 1  I have felt scared or panicky for no  good reason. 1  Things have been getting on top of me. 1  I have been so unhappy that I have had difficulty sleeping. 0  I have felt sad or miserable. 0  I have been so unhappy that I have been crying. 1  The thought of harming myself has occurred to me. 0  Edinburgh Postnatal Depression Scale Total 6      After visit meds:  Allergies as of 12/04/2022   No Known Allergies      Medication List     STOP taking these medications    valACYclovir 1000 MG tablet Commonly known as: VALTREX       TAKE these medications    NIFEdipine 30 MG 24 hr tablet Commonly known as: PROCARDIA-XL/NIFEDICAL-XL Take 1 tablet (30 mg total) by mouth daily.   oxyCODONE 5 MG immediate release tablet Commonly known as: Oxy IR/ROXICODONE Take 1-2 tablets (5-10 mg total) by mouth every 4 (four) hours as needed for moderate pain.   PEPCID PO Take by mouth.   prenatal vitamin w/FE, FA 29-1 MG Chew chewable tablet Chew 1 tablet by mouth daily at 12 noon.                Discharge Care Instructions  (From admission, onward)           Start     Ordered   12/04/22 0000  Discharge wound care:       Comments: Sitz baths and icepacks to perineum.  If stitches, they will dissolve.   12/04/22 1704             Discharge home in stable condition Infant Feeding:  ? Infant Disposition:home with mother Discharge instruction: per After Visit Summary and Postpartum booklet. Activity: Advance as tolerated. Pelvic rest for 6 weeks.  Diet: low salt diet Anticipated Birth Control: Unsure Postpartum Appointment:4 weeks Additional Postpartum F/U: BP check 1 week Future Appointments: Future Appointments  Date Time Provider [place]  12/06/2022  9:20 AM Berniece Salines, DO [facility] None  12/08/2022  7:00 AM [id number]   Follow up Visit:  Follow-up Information     Bobbye Charleston, MD Follow up in 4 week(s).   Specialty: Obstetrics and Gynecology Contact information: [address] [place]. [place] [address] [phone]                     12/04/2022 Daria Pastures, MD

## 2022-12-04 NOTE — Progress Notes (Signed)
  Patient is eating, ambulating, voiding.  Pain control is good.  Vitals:   12/03/22 1735 12/03/22 2008 12/03/22 2323 12/04/22 0526  BP:  137/69 (!) 146/82 (!) 141/68  Pulse:  70 74 78  Resp: 16 19 18 18   Temp:  97.8 F (36.6 C) 97.7 F (36.5 C) 98.5 F (36.9 C)  TempSrc:  Oral Oral Oral  SpO2:  98% 98% 98%  Weight:      Height:        lungs:   clear to auscultation cor:    RRR Abdomen:  soft, appropriate tenderness, incisions intact and without erythema or exudate ex:    no cords   Lab Results  Component Value Date   WBC 13.9 (H) 12/04/2022   HGB 7.5 (L) 12/04/2022   HCT 23.6 (L) 12/04/2022   MCV 78.7 (L) 12/04/2022   PLT 176 12/04/2022    --/--/O NEG (12/23 1228)/RI  A/P    Post operative day 2 repeat C/S and severe preeclampsia. Pt BP stable off Magnesium last night and on Procardia 30 mg XL.  Will continue to watch in case needs additional BP meds.  H/H 7.5- pt stable, iron.  Ca+ repleted.  Routine post op and postpartum care.  Expect d/c tomorrow.  Oxycodone for pain control.    Baby A neg, no Rhogam needed.

## 2022-12-04 NOTE — Plan of Care (Signed)
  Problem: Education: Goal: Knowledge of the prescribed therapeutic regimen will improve Outcome: Adequate for Discharge   Problem: Fluid Volume: Goal: Peripheral tissue perfusion will improve Outcome: Adequate for Discharge   Problem: Clinical Measurements: Goal: Complications related to disease process, condition or treatment will be avoided or minimized Outcome: Adequate for Discharge   Problem: Health Behavior/Discharge Planning: Goal: Ability to manage health-related needs will improve Outcome: Adequate for Discharge   Problem: Clinical Measurements: Goal: Ability to maintain clinical measurements within normal limits will improve Outcome: Adequate for Discharge Goal: Will remain free from infection Outcome: Adequate for Discharge Goal: Diagnostic test results will improve Outcome: Adequate for Discharge Goal: Respiratory complications will improve Outcome: Adequate for Discharge Goal: Cardiovascular complication will be avoided Outcome: Adequate for Discharge   Problem: Activity: Goal: Risk for activity intolerance will decrease Outcome: Adequate for Discharge   Problem: Coping: Goal: Level of anxiety will decrease Outcome: Adequate for Discharge   Problem: Elimination: Goal: Will not experience complications related to bowel motility Outcome: Adequate for Discharge Goal: Will not experience complications related to urinary retention Outcome: Adequate for Discharge   Problem: Pain Managment: Goal: General experience of comfort will improve Outcome: Adequate for Discharge   Problem: Safety: Goal: Ability to remain free from injury will improve Outcome: Adequate for Discharge   Problem: Skin Integrity: Goal: Risk for impaired skin integrity will decrease Outcome: Adequate for Discharge   Problem: Education: Goal: Knowledge of the prescribed therapeutic regimen will improve Outcome: Adequate for Discharge Goal: Understanding of sexual limitations or changes  related to disease process or condition will improve Outcome: Adequate for Discharge Goal: Individualized Educational Video(s) Outcome: Adequate for Discharge   Problem: Self-Concept: Goal: Communication of feelings regarding changes in body function or appearance will improve Outcome: Adequate for Discharge   Problem: Skin Integrity: Goal: Demonstration of wound healing without infection will improve Outcome: Adequate for Discharge   Problem: Education: Goal: Knowledge of condition will improve Outcome: Adequate for Discharge Goal: Individualized Educational Video(s) Outcome: Adequate for Discharge Goal: Individualized Newborn Educational Video(s) Outcome: Adequate for Discharge   Problem: Activity: Goal: Will verbalize the importance of balancing activity with adequate rest periods Outcome: Adequate for Discharge Goal: Ability to tolerate increased activity will improve Outcome: Adequate for Discharge   Problem: Coping: Goal: Ability to identify and utilize available resources and services will improve Outcome: Adequate for Discharge   Problem: Life Cycle: Goal: Chance of risk for complications during the postpartum period will decrease Outcome: Adequate for Discharge   Problem: Role Relationship: Goal: Ability to demonstrate positive interaction with newborn will improve Outcome: Adequate for Discharge   Problem: Skin Integrity: Goal: Demonstration of wound healing without infection will improve Outcome: Adequate for Discharge

## 2022-12-04 NOTE — Progress Notes (Signed)
   12/04/22 1919  Departure Condition  Departure Condition Good  Mobility at [facility]  Patient/Caregiver Teaching Teach Back Method Used;Discharge instructions reviewed;Prescriptions reviewed;Follow-up care reviewed;Pain management discussed;Patient/caregiver verbalized understanding;Educated about hypertension in pregnancy  Departure Mode With family (Pt rooming in with infant)  Was procedural sedation performed on this patient during this visit? No   Patient alert and oriented x4, VS and pain stable. Patient rooming in with infant until infant discharge.

## 2022-12-04 NOTE — Lactation Note (Signed)
This note was copied from a baby's chart. Lactation Consultation Note  Patient Name: Marissa Stevenson CHYIF'O Date: 12/04/2022 Reason for consult: Follow-up assessment;Difficult latch;Late-preterm 34-36.6wks;Infant weight loss;Breastfeeding assistance (5.82% WL) Age:36 hours  LC entered the room and the infant was in the bassinet.  Per the birth parent, she has put the infant to the breast about 4 times since giving birth.  She stated that she has been given the equipment to use for pumping.  The birth parent commented that today is the first time she has felt like a normal human due to being on medication.  The birth parent said that she has a pump at home and she will probably start to pump when she returns home.  She is aware that she needs to stimulate her breasts to make milk.  She remarked that she had to supplement her oldest and she will pump and supplement with formula with this baby.  LC congratulated the birth parent, told her that she was happy that she felt better, and reminded her about outpatient services if she ever needs them.  The birth parent thanked the [facility] for being so nice to her.  She said that she does not want lactation to continue to see her.   Maternal Data    Feeding Nipple Type: Extra Slow Flow  LATCH Score                    Lactation Tools Discussed/Used    Interventions    Discharge    Consult Status Consult Status: Complete (mother declined follow up) Date: 12/04/22 Follow-up type: Call as needed    Delene Loll 12/04/2022, 3:55 PM

## 2022-12-04 NOTE — Progress Notes (Signed)
Vitals:   12/03/22 2323 12/04/22 0526 12/04/22 0757 12/04/22 1208  BP: (!) 146/82 (!) 141/68 130/79 137/77  Pulse: 74 78 80 84  Resp: 18 18 18 18   Temp: 97.7 F (36.5 C) 98.5 F (36.9 C) 98 F (36.7 C) 97.6 F (36.4 C)  TempSrc: Oral Oral Oral Oral  SpO2: 98% 98% 100% 98%  Weight:      Height:        BPs very stable on Procardia 30 mg XL.  Will d/c to home, have pt check BPs at home.

## 2022-12-05 LAB — TYPE AND SCREEN
ABO/RH(D): O NEG
Antibody Screen: POSITIVE
Unit division: 0
Unit division: 0

## 2022-12-05 LAB — BPAM RBC
Blood Product Expiration Date: 202401022359
Blood Product Expiration Date: 202401032359
ISSUE DATE / TIME: 202312141235
ISSUE DATE / TIME: 202312141235
Unit Type and Rh: 9500
Unit Type and Rh: 9500

## 2022-12-05 MED FILL — Fentanyl Citrate Preservative Free (PF) Inj 100 MCG/2ML: INTRAMUSCULAR | Qty: 0.3 | Status: AC

## 2022-12-06 ENCOUNTER — Ambulatory Visit: Payer: BC Managed Care – PPO | Admitting: Cardiology

## 2022-12-06 ENCOUNTER — Encounter (HOSPITAL_COMMUNITY): Payer: Self-pay | Admitting: Obstetrics and Gynecology

## 2022-12-06 LAB — SURGICAL PATHOLOGY

## 2022-12-08 ENCOUNTER — Other Ambulatory Visit (HOSPITAL_COMMUNITY): Payer: BC Managed Care – PPO

## 2022-12-12 ENCOUNTER — Telehealth (HOSPITAL_COMMUNITY): Payer: Self-pay | Admitting: *Deleted

## 2022-12-12 NOTE — Telephone Encounter (Signed)
Mom reports feeling good. Has woken up with a few headaches this week, but they were relieved with meds. Will notify MD if they continue or worsen. Incision healing well per mom. No concerns regarding herself at this time. EPDS=6 (hospital score=6) Mom reports baby is well. Feeding, peeing, and pooping without difficulty. Reviewed safe sleep. Mom has no concerns about baby at present.  Odis Hollingshead, RN 12-12-2022 at 10:09am

## 2022-12-15 ENCOUNTER — Inpatient Hospital Stay (HOSPITAL_COMMUNITY)
Admission: AD | Admit: 2022-12-15 | Discharge: 2022-12-15 | Disposition: A | Discharge status: Home / Self Care | Payer: BC Managed Care – PPO | Attending: Obstetrics and Gynecology | Admitting: Obstetrics and Gynecology

## 2022-12-15 ENCOUNTER — Inpatient Hospital Stay (HOSPITAL_COMMUNITY): Payer: BC Managed Care – PPO

## 2022-12-15 ENCOUNTER — Encounter (HOSPITAL_COMMUNITY): Payer: Self-pay | Admitting: Obstetrics and Gynecology

## 2022-12-15 DIAGNOSIS — O165 Unspecified maternal hypertension, complicating the puerperium: Secondary | ICD-10-CM | POA: Diagnosis not present

## 2022-12-15 DIAGNOSIS — Z3A00 Weeks of gestation of pregnancy not specified: Secondary | ICD-10-CM

## 2022-12-15 DIAGNOSIS — I10 Essential (primary) hypertension: Secondary | ICD-10-CM | POA: Insufficient documentation

## 2022-12-15 DIAGNOSIS — R079 Chest pain, unspecified: Secondary | ICD-10-CM | POA: Diagnosis not present

## 2022-12-15 DIAGNOSIS — O9089 Other complications of the puerperium, not elsewhere classified: Secondary | ICD-10-CM | POA: Insufficient documentation

## 2022-12-15 LAB — COMPREHENSIVE METABOLIC PANEL
ALT: 18 U/L (ref 0–44)
AST: 20 U/L (ref 15–41)
Albumin: 3.2 g/dL — ABNORMAL LOW (ref 3.5–5.0)
Alkaline Phosphatase: 89 U/L (ref 38–126)
Anion gap: 9 (ref 5–15)
BUN: 11 mg/dL (ref 6–20)
CO2: 23 mmol/L (ref 22–32)
Calcium: 8.9 mg/dL (ref 8.9–10.3)
Chloride: 106 mmol/L (ref 98–111)
Creatinine, Ser: 0.51 mg/dL (ref 0.44–1.00)
GFR, Estimated: >60 mL/min (ref >60)
Glucose, Bld: 101 mg/dL — ABNORMAL HIGH (ref 70–99)
Potassium: 3.9 mmol/L (ref 3.5–5.1)
Sodium: 138 mmol/L (ref 135–145)
Total Bilirubin: 0.7 mg/dL (ref 0.3–1.2)
Total Protein: 6.8 g/dL (ref 6.5–8.1)

## 2022-12-15 LAB — CBC
HCT: 29.1 % — ABNORMAL LOW (ref 36.0–46.0)
Hemoglobin: 9.3 g/dL — ABNORMAL LOW (ref 12.0–15.0)
MCH: 24.9 pg — ABNORMAL LOW (ref 26.0–34.0)
MCHC: 32 g/dL (ref 30.0–36.0)
MCV: 77.8 fL — ABNORMAL LOW (ref 80.0–100.0)
Platelets: 369 10*3/uL (ref 150–400)
RBC: 3.74 MIL/uL — ABNORMAL LOW (ref 3.87–5.11)
RDW: 15.2 % (ref 11.5–15.5)
WBC: 7.3 10*3/uL (ref 4.0–10.5)
nRBC: 0 % (ref 0.0–0.2)

## 2022-12-15 MED ORDER — NIFEDIPINE ER OSMOTIC RELEASE 30 MG PO TB24
30.0000 mg | ORAL_TABLET | Freq: Every day | ORAL | Status: DC
  Administered 2022-12-15: 30 mg via ORAL
  Filled 2022-12-15: qty 1

## 2022-12-15 MED ORDER — NIFEDIPINE ER OSMOTIC RELEASE 30 MG PO TB24: 30.0000 mg | ORAL_TABLET | Freq: Two times a day (BID) | ORAL | 1 refills | Status: DC

## 2022-12-15 MED ORDER — LACTATED RINGERS IV BOLUS
1000.0000 mL | Freq: Once | INTRAVENOUS | Status: AC
  Administered 2022-12-15: 1000 mL via INTRAVENOUS

## 2022-12-15 MED ORDER — HYDRALAZINE HCL 10 MG PO TABS
10.0000 mg | ORAL_TABLET | Freq: Once | ORAL | Status: AC
  Administered 2022-12-15: 10 mg via ORAL
  Filled 2022-12-15: qty 1

## 2022-12-15 NOTE — Progress Notes (Signed)
Candie Chroman, CNM remains @ bedside while BP being taken.  Will ask CNM if Labetalol protocol will be initiated.

## 2022-12-15 NOTE — Progress Notes (Signed)
Marissa Stevenson, CNM @ bedside.

## 2022-12-15 NOTE — MAU Note (Addendum)
.  Marissa Stevenson is a 37 y.o. at [redacted]w[redacted]d here in MAU reporting: PP c-section 12/02/22. For elevated b/p. Stated she has had a headache most days when she wakes up but it goes away with tylenol. Had one earlier today but it is gone. Denies any visual changes or abd pain but has had some chest pain off and of since yesterday. Took b/p at home today and it was 171//103.( 2pm) And took it again 145/98. On procardia 30 mg daily.  Onset of complaint: today Pain score: 0 Vitals:   12/15/22 1606  BP: (!) 153/97  Pulse: 86  Resp: 18     FHT:n/a Lab orders placed from triage:

## 2022-12-20 ENCOUNTER — Inpatient Hospital Stay (HOSPITAL_COMMUNITY): Admit: 2022-12-20 | Payer: BC Managed Care – PPO | Admitting: Obstetrics

## 2022-12-29 ENCOUNTER — Ambulatory Visit: Payer: BC Managed Care – PPO | Attending: Cardiology | Admitting: Cardiology

## 2022-12-29 ENCOUNTER — Encounter: Payer: Self-pay | Admitting: Cardiology

## 2022-12-29 VITALS — BP 118/78 | HR 95 | Ht 68.0 in | Wt 156.8 lb

## 2022-12-29 DIAGNOSIS — Z8759 Personal history of other complications of pregnancy, childbirth and the puerperium: Secondary | ICD-10-CM | POA: Diagnosis not present

## 2022-12-29 DIAGNOSIS — Z79899 Other long term (current) drug therapy: Secondary | ICD-10-CM | POA: Diagnosis not present

## 2022-12-29 DIAGNOSIS — O165 Unspecified maternal hypertension, complicating the puerperium: Secondary | ICD-10-CM

## 2022-12-29 LAB — BASIC METABOLIC PANEL
BUN/Creatinine Ratio: 12 (ref 9–23)
BUN: 8 mg/dL (ref 6–20)
CO2: 23 mmol/L (ref 20–29)
Calcium: 9.8 mg/dL (ref 8.7–10.2)
Chloride: 104 mmol/L (ref 96–106)
Creatinine, Ser: 0.65 mg/dL (ref 0.57–1.00)
Glucose: 91 mg/dL (ref 70–99)
Potassium: 4.6 mmol/L (ref 3.5–5.2)
Sodium: 139 mmol/L (ref 134–144)
eGFR: 117 mL/min/{1.73_m2} (ref >59)

## 2022-12-29 LAB — MAGNESIUM: Magnesium: 2.1 mg/dL (ref 1.6–2.3)

## 2022-12-29 MED ORDER — FUROSEMIDE 20 MG PO TABS: 20.0000 mg | ORAL_TABLET | ORAL | 0 refills | Status: DC

## 2022-12-29 MED ORDER — POTASSIUM CHLORIDE ER 10 MEQ PO TBCR: 10.0000 meq | EXTENDED_RELEASE_TABLET | ORAL | 0 refills | Status: DC
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
## 2022-12-29 NOTE — Patient Instructions (Signed)
Medication Instructions:  Your physician has recommended you make the following change in your medication:  START: Lasix 20 mg once weekly START: Potassium 10 mEq once weekly  *If you need a refill on your cardiac medications before your next appointment, please call your pharmacy*   Lab Work: Your physician recommends that you have labs drawn today: BMET, Mag  If you have labs (blood work) drawn today and your tests are completely normal, you will receive your results only by: [place] (if you have MyChart) OR A paper copy in the mail If you have any lab test that is abnormal or we need to change your treatment, we will call you to review the results.   Testing/Procedures: None   Follow-Up: At [facility], you and your health needs are our priority.  As part of our continuing mission to provide you with exceptional heart care, we have created designated Provider Care Teams.  These Care Teams include your primary Cardiologist (physician) and Advanced Practice Providers (APPs -  Physician Assistants and Nurse Practitioners) who all work together to provide you with the care you need, when you need it.  We recommend signing up for the patient portal called "MyChart".  Sign up information is provided on this After Visit Summary.  MyChart is used to connect with patients for Virtual Visits (Telemedicine).  Patients are able to view lab/test results, encounter notes, upcoming appointments, etc.  Non-urgent messages can be sent to your provider as well.   To learn more about what you can do with MyChart, go to [email or web address].    Your next appointment:   12 week(s)  Provider:   Berniece Salines, DO     Other Instructions

## 2023-01-01 ENCOUNTER — Ambulatory Visit (HOSPITAL_COMMUNITY): Payer: BC Managed Care – PPO | Attending: Cardiology

## 2023-01-01 DIAGNOSIS — R0602 Shortness of breath: Secondary | ICD-10-CM | POA: Insufficient documentation

## 2023-01-01 LAB — ECHOCARDIOGRAM COMPLETE
Area-P 1/2: 3 cm2
S' Lateral: 3.5 cm

## 2023-01-02 DIAGNOSIS — Z124 Encounter for screening for malignant neoplasm of cervix: Secondary | ICD-10-CM | POA: Diagnosis not present

## 2023-01-26 DIAGNOSIS — F4322 Adjustment disorder with anxiety: Secondary | ICD-10-CM | POA: Diagnosis not present

## 2023-04-09 DIAGNOSIS — Z0000 Encounter for general adult medical examination without abnormal findings: Secondary | ICD-10-CM | POA: Diagnosis not present

## 2023-04-17 ENCOUNTER — Ambulatory Visit: Payer: BC Managed Care – PPO | Attending: Cardiology | Admitting: Cardiology

## 2023-04-17 ENCOUNTER — Encounter: Payer: Self-pay | Admitting: Cardiology

## 2023-04-17 VITALS — BP 108/66 | HR 85 | Ht 68.0 in | Wt 154.2 lb

## 2023-04-17 DIAGNOSIS — Z8759 Personal history of other complications of pregnancy, childbirth and the puerperium: Secondary | ICD-10-CM | POA: Diagnosis not present

## 2023-04-17 DIAGNOSIS — O165 Unspecified maternal hypertension, complicating the puerperium: Secondary | ICD-10-CM

## 2023-04-17 NOTE — Patient Instructions (Signed)
Medication Instructions:  Your physician recommends that you continue on your current medications as directed. Please refer to the Current Medication list given to you today.  *If you need a refill on your cardiac medications before your next appointment, please call your pharmacy*   Lab Work: None   Testing/Procedures: None   Follow-Up: At [facility], you and your health needs are our priority.  As part of our continuing mission to provide you with exceptional heart care, we have created designated Provider Care Teams.  These Care Teams include your primary Cardiologist (physician) and Advanced Practice Providers (APPs -  Physician Assistants and Nurse Practitioners) who all work together to provide you with the care you need, when you need it.  Your next appointment:    As needed  Provider:   Kardie Tobb, DO     
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
## 2023-04-23 DIAGNOSIS — F4322 Adjustment disorder with anxiety: Secondary | ICD-10-CM | POA: Diagnosis not present

## 2023-07-06 DIAGNOSIS — F4322 Adjustment disorder with anxiety: Secondary | ICD-10-CM | POA: Diagnosis not present

## 2023-08-31 DIAGNOSIS — F4322 Adjustment disorder with anxiety: Secondary | ICD-10-CM | POA: Diagnosis not present

## 2023-10-30 DIAGNOSIS — N952 Postmenopausal atrophic vaginitis: Secondary | ICD-10-CM | POA: Diagnosis not present

## 2023-10-30 DIAGNOSIS — N9419 Other specified dyspareunia: Secondary | ICD-10-CM | POA: Diagnosis not present

## 2023-11-12 ENCOUNTER — Telehealth: Payer: Self-pay | Admitting: Cardiology

## 2023-11-12 NOTE — Telephone Encounter (Signed)
Patient scheduled for 12/6 at 2:30 with Bradd Burner, NP.

## 2023-11-12 NOTE — Telephone Encounter (Signed)
Called and spoke to patient who reports on Thanksgiving morning she woke up with chest and back pain with some pulling across her left chest when she tried lifting her left  arm. She was also unable to lift her left arm at that time. She deny any numbness or tingling at that time. She reports she took Ibuprofen 400 mg and later she took Tylenol 1000 mg which seemed to help with the discomfort. Friday, Saturday and Sunday she report just having the dull ache in her chest and she was able to lift her arm a little.  She deny having SOB,dizziness lightheadedness or blurred vision. She stated she is having a lot off burping which she did take some OTC reflux medication. Patient report she is still having the dull ache in her chest today and discomfort when she press on her chest but deny any other symptoms at this time. BP on Thanksgiving day was 130/81 Please advise.

## 2023-11-12 NOTE — Telephone Encounter (Signed)
   Pt c/o of Chest Pain: STAT if active CP, including tightness, pressure, jaw pain, radiating pain to shoulder/upper arm/back, CP unrelieved by Nitro. Symptoms reported of SOB, nausea, vomiting, sweating.  1. Are you having CP right now? Dull throab ache     2. Are you experiencing any other symptoms (ex. SOB, nausea, vomiting, sweating)? No    3. Is your CP continuous or coming and going? continuous   4. Have you taken Nitroglycerin? No    5. How long have you been experiencing CP? 11/28    6. If NO CP at time of call then end call with telling Pt to call back or call 911 if Chest pain returns prior to return call from triage team.

## 2023-11-14 NOTE — Progress Notes (Signed)
Cardiology Office Note    Date:  11/16/2023  ID:  Marissa Stevenson, DOB 1985-12-31, MRN [id number] PCP:  Patient, No Pcp Per  Cardiologist:  Thomasene Ripple, DO  Electrophysiologist:  None   Chief Complaint: Back and Chest pain   History of Present Illness: .    Marissa Stevenson is a 37 y.o. female(G2P1001) with visit-pertinent history of chronic neck pain, postpartum hypertension and dizziness.   First seen by Dr. Servando Salina in 11/2022 for postpartum hypertension.  She was started on nifedipine, following her visit patient developed preeclampsia and delivered.  She was last seen by Dr. Servando Salina on 04/17/2023 and she had been tapered off of the antihypertensive with good blood pressure control.  On 11/12/2023 she notified our office of chest pain described as a dull throbbing ache that had started on 11/28.   Today she presents regarding chest discomfort. She reports that on Thanksgiving she felt as though she pulled a muscle in her left upper back, would then have a pulling sensation across her chest when she turned her head to the left.  She then developed an aching sensation in her chest.  She did take some ibuprofen and Tylenol with improvement in musculoskeletal pain although noted a slightly persistent deeper ache.  She notes this has been intermittent since Thanksgiving.  Typically does not start with exertion.  She reports that the pain worsens when leaning forward or backwards or when taking a deep breath. She does note that she regularly has to pick up her 68-month-old and her 86-year-old son, questions if this is musculoskeletal pain.  She does also note that she has had dyspnea on exertion such as when she climbs a 2 flights of stairs in her house.  ROS: .   Today she denies lower extremity edema, fatigue, palpitations, melena, hematuria, hemoptysis, diaphoresis, weakness, presyncope, syncope, orthopnea, and PND.  All other systems are reviewed and otherwise negative. Studies Reviewed: Marland Kitchen    EKG:   EKG is ordered today, personally reviewed, demonstrating  EKG Interpretation Date/Time:  Friday November 16 2023 14:39:16 EST Ventricular Rate:  75 PR Interval:  138 QRS Duration:  82 QT Interval:  382 QTC Calculation: 426 R Axis:   47  Text Interpretation: Normal sinus rhythm Normal ECG No significant change was found Confirmed by Reather Littler [phone]) on 11/16/2023 3:15:11 PM    CV Studies:  Cardiac Studies & Procedures       ECHOCARDIOGRAM  ECHOCARDIOGRAM COMPLETE 01/02/2023  Narrative ECHOCARDIOGRAM REPORT    Patient Name:   Marissa Stevenson Date of Exam: 01/01/2023 Medical Rec #:  [id number]       Height:       68.0 in Accession #:    [id number]      Weight:       156.8 lb Date of Birth:  Sep 05, 1986       BSA:          1.843 m Patient Age:    36 years        BP:           118/78 mmHg Patient Gender: F               HR:           75 bpm. Exam Location:  [address]  Procedure: 2D Echo, Cardiac Doppler and Color Doppler  Indications:    R06.02 SOB  History:        Patient has no prior history of Echocardiogram examinations.  Signs/Symptoms:Shortness of Breath.  Sonographer:    Samule Ohm RDCS Referring Phys: [id number] KARDIE TOBB  IMPRESSIONS   1. Left ventricular ejection fraction, by estimation, is 50 to 55%. The left ventricle has low normal function. The left ventricle has no regional wall motion abnormalities. Left ventricular diastolic parameters were normal. 2. Right ventricular systolic function is normal. The right ventricular size is normal. There is normal pulmonary artery systolic pressure. The estimated right ventricular systolic pressure is 18.1 mmHg. 3. The mitral valve is grossly normal. Trivial mitral valve regurgitation. No evidence of mitral stenosis. 4. The aortic valve is tricuspid. Aortic valve regurgitation is not visualized. No aortic stenosis is present. 5. The inferior vena cava is normal in size with greater than 50% respiratory  variability, suggesting right atrial pressure of 3 mmHg.  FINDINGS Left Ventricle: Left ventricular ejection fraction, by estimation, is 50 to 55%. The left ventricle has low normal function. The left ventricle has no regional wall motion abnormalities. The left ventricular internal cavity size was normal in size. There is no left ventricular hypertrophy. Left ventricular diastolic parameters were normal.  Right Ventricle: The right ventricular size is normal. No increase in right ventricular wall thickness. Right ventricular systolic function is normal. There is normal pulmonary artery systolic pressure. The tricuspid regurgitant velocity is 1.94 m/s, and with an assumed right atrial pressure of 3 mmHg, the estimated right ventricular systolic pressure is 18.1 mmHg.  Left Atrium: Left atrial size was normal in size.  Right Atrium: Right atrial size was normal in size.  Pericardium: There is no evidence of pericardial effusion.  Mitral Valve: The mitral valve is grossly normal. Trivial mitral valve regurgitation. No evidence of mitral valve stenosis.  Tricuspid Valve: The tricuspid valve is grossly normal. Tricuspid valve regurgitation is trivial. No evidence of tricuspid stenosis.  Aortic Valve: The aortic valve is tricuspid. Aortic valve regurgitation is not visualized. No aortic stenosis is present.  Pulmonic Valve: The pulmonic valve was grossly normal. Pulmonic valve regurgitation is trivial. No evidence of pulmonic stenosis.  Aorta: The aortic root and ascending aorta are structurally normal, with no evidence of dilitation.  Venous: The inferior vena cava is normal in size with greater than 50% respiratory variability, suggesting right atrial pressure of 3 mmHg.  IAS/Shunts: The atrial septum is grossly normal.   LEFT VENTRICLE PLAX 2D LVIDd:         4.90 cm   Diastology LVIDs:         3.50 cm   LV e' medial:    8.27 cm/s LV PW:         1.00 cm   LV E/e' medial:  8.1 LV IVS:         1.10 cm   LV e' lateral:   9.03 cm/s LVOT diam:     2.10 cm   LV E/e' lateral: 7.4 LV SV:         67 LV SV Index:   36 LVOT Area:     3.46 cm   RIGHT VENTRICLE             IVC RV S prime:     20.40 cm/s  IVC diam: 1.30 cm TAPSE (M-mode): 2.0 cm RVSP:           18.1 mmHg  LEFT ATRIUM             Index        RIGHT ATRIUM  Index LA diam:        3.20 cm 1.74 cm/m   RA Pressure: 3.00 mmHg LA Vol (A2C):   36.5 ml 19.81 ml/m  RA Area:     12.30 cm LA Vol (A4C):   45.3 ml 24.58 ml/m  RA Volume:   28.00 ml  15.19 ml/m LA Biplane Vol: 40.6 ml 22.03 ml/m AORTIC VALVE LVOT Vmax:   97.40 cm/s LVOT Vmean:  66.300 cm/s LVOT VTI:    0.193 m  AORTA Ao Root diam: 3.60 cm Ao Asc diam:  3.00 cm  MITRAL VALVE               TRICUSPID VALVE MV Area (PHT): 3.00 cm    TR Peak grad:   15.1 mmHg MV Decel Time: 253 msec    TR Vmax:        194.00 cm/s MV E velocity: 67.10 cm/s  Estimated RAP:  3.00 mmHg MV A velocity: 79.70 cm/s  RVSP:           18.1 mmHg MV E/A ratio:  0.84 SHUNTS Systemic VTI:  0.19 m Systemic Diam: 2.10 cm  Lennie Odor MD Electronically signed by Lennie Odor MD Signature Date/Time: 01/01/2023/7:23:54 PM    Final              Current Reported Medications:.    No outpatient medications have been marked as taking for the 11/16/23 encounter (Office Visit) with [name], NP.   Physical Exam:    VS:  BP 120/70 (BP Location: Right Arm, Patient Position: Sitting, Cuff Size: Normal)   Pulse 81   Ht 5\' 8"  (1.727 m)   Wt 156 lb (70.8 kg)   SpO2 99%   Breastfeeding No   BMI 23.72 kg/m    Wt Readings from Last 3 Encounters:  11/16/23 156 lb (70.8 kg)  04/17/23 154 lb 3.2 oz (69.9 kg)  12/29/22 156 lb 12.8 oz (71.1 kg)    GEN: Well nourished, well developed in no acute distress NECK: No JVD; No carotid bruits CARDIAC: RRR, no murmurs, rubs, gallops RESPIRATORY:  Clear to auscultation without rales, wheezing or rhonchi  ABDOMEN: Soft,  non-tender, non-distended EXTREMITIES:  No edema; No acute deformity   Asessement and Plan:.    Precordial pain/shortness of breath: Patient reports intermittent back and chest pain since Thanksgiving.  She notes that the musculoskeletal pain improved with 1 dose of ibuprofen and Tylenol although she has had a deeper persistent ache, not specifically associated with exertion.  She also notes that she had difficulty lifting her left arm intermittently.  She reports that the dull ache in her chest worsens when leaning forward, backwards or when taking a deep breath.  Pain did change with palpation.  She has an 34-month-old and a 59-year-old both like to be frequently picked up, lifting her children does cause increase in her pain.  She does note she has had some increased shortness of breath when climbing 2 flights of stairs in her house, she is unsure if this is simply related to deconditioning since having her child earlier this year.  Overall her chest and back pain sounds musculoskeletal however given worsening symptoms with deep breath or when leaning forward or back will check CRP and sed rate.  Will also check echocardiogram given increased dyspnea on exertion.  She will start on ibuprofen 400 mg 3 times a day for a week.  Check CBC, BMET, CRP and Sed Rate.   Postpartum hypertension: Blood pressure today  120/70. Patient previously on nifedipine for postpartum hypertension, she is no longer requiring this.   Disposition: F/u with Reather Littler, NP following echocardiogram   Signed, [name], NP

## 2023-11-16 ENCOUNTER — Ambulatory Visit: Payer: BC Managed Care – PPO | Attending: Cardiology | Admitting: Cardiology

## 2023-11-16 ENCOUNTER — Encounter: Payer: Self-pay | Admitting: Cardiology

## 2023-11-16 VITALS — BP 120/70 | HR 81 | Ht 68.0 in | Wt 156.0 lb

## 2023-11-16 DIAGNOSIS — Z8759 Personal history of other complications of pregnancy, childbirth and the puerperium: Secondary | ICD-10-CM

## 2023-11-16 DIAGNOSIS — R0602 Shortness of breath: Secondary | ICD-10-CM

## 2023-11-16 DIAGNOSIS — R079 Chest pain, unspecified: Secondary | ICD-10-CM

## 2023-11-16 DIAGNOSIS — R072 Precordial pain: Secondary | ICD-10-CM

## 2023-11-16 DIAGNOSIS — Z789 Other specified health status: Secondary | ICD-10-CM | POA: Diagnosis not present

## 2023-11-16 DIAGNOSIS — O165 Unspecified maternal hypertension, complicating the puerperium: Secondary | ICD-10-CM

## 2023-11-16 NOTE — Patient Instructions (Addendum)
Medication Instructions:  Take Ibuprofen 400 mg 3 times a day for 1 week *If you need a refill on your cardiac medications before your next appointment, please call your pharmacy*  Lab Work: Today we will draw BMP, CBC, CRP, and sed rate If you have labs (blood work) drawn today and your tests are completely normal, you will receive your results only by: MyChart Message (if you have MyChart) OR A paper copy in the mail If you have any lab test that is abnormal or we need to change your treatment, we will call you to review the results.  Testing/Procedures: Your physician has requested that you have an echocardiogram. Echocardiography is a painless test that uses sound waves to create images of your heart. It provides your doctor with information about the size and shape of your heart and how well your heart's chambers and valves are working. This procedure takes approximately one hour. There are no restrictions for this procedure. Please do NOT wear cologne, perfume, aftershave, or lotions (deodorant is allowed). Please arrive 15 minutes prior to your appointment time.  Please note: We ask at that you not bring children with you during ultrasound (echo/ vascular) testing. Due to room size and safety concerns, children are not allowed in the ultrasound rooms during exams. Our front office staff cannot provide observation of children in our lobby area while testing is being conducted. An adult accompanying a patient to their appointment will only be allowed in the ultrasound room at the discretion of the ultrasound technician under special circumstances. We apologize for any inconvenience.  Follow-Up: At [facility], you and your health needs are our priority.  As part of our continuing mission to provide you with exceptional heart care, we have created designated Provider Care Teams.  These Care Teams include your primary Cardiologist (physician) and Advanced Practice Providers (APPs -   Physician Assistants and Nurse Practitioners) who all work together to provide you with the care you need, when you need it.  We recommend signing up for the patient portal called "MyChart".  Sign up information is provided on this After Visit Summary.  MyChart is used to connect with patients for Virtual Visits (Telemedicine).  Patients are able to view lab/test results, encounter notes, upcoming appointments, etc.  Non-urgent messages can be sent to your provider as well.   To learn more about what you can do with MyChart, go to [email or web address].    Your next appointment:   6 week(s)  Provider:   Reather Littler, NP

## 2023-11-17 LAB — SEDIMENTATION RATE: Sed Rate: 4 mm/h (ref 0–32)

## 2023-11-17 LAB — BASIC METABOLIC PANEL
BUN/Creatinine Ratio: 11 (ref 9–23)
BUN: 9 mg/dL (ref 6–20)
CO2: 23 mmol/L (ref 20–29)
Calcium: 9.4 mg/dL (ref 8.7–10.2)
Chloride: 104 mmol/L (ref 96–106)
Creatinine, Ser: 0.8 mg/dL (ref 0.57–1.00)
Glucose: 85 mg/dL (ref 70–99)
Potassium: 4.5 mmol/L (ref 3.5–5.2)
Sodium: 141 mmol/L (ref 134–144)
eGFR: 97 mL/min/{1.73_m2} (ref >59)

## 2023-11-17 LAB — CBC
Hematocrit: 37 % (ref 34.0–46.6)
Hemoglobin: 11.6 g/dL (ref 11.1–15.9)
MCH: 25.7 pg — ABNORMAL LOW (ref 26.6–33.0)
MCHC: 31.4 g/dL — ABNORMAL LOW (ref 31.5–35.7)
MCV: 82 fL (ref 79–97)
Platelets: 276 10*3/uL (ref 150–450)
RBC: 4.51 x10E6/uL (ref 3.77–5.28)
RDW: 14.5 % (ref 11.7–15.4)
WBC: 8.8 10*3/uL (ref 3.4–10.8)

## 2023-11-17 LAB — C-REACTIVE PROTEIN: CRP: <1 mg/L (ref 0–10)

## 2023-11-19 ENCOUNTER — Telehealth: Payer: Self-pay

## 2023-11-19 NOTE — Telephone Encounter (Signed)
-----   Message from [name] sent at 11/19/2023  8:10 AM EST ----- Please let Marissa Stevenson know that her hemoglobin and hematocrit are normal. There are indicators that her iron levels could be low, would recommend following with her PCP for ongoing monitoring. Her kidney function and electrolytes are normal and her inflammatory markers are normal. Overall good results! Continue with plan discussed at appointment.

## 2023-11-19 NOTE — Telephone Encounter (Signed)
called patient advised of below they verbalized understanding.

## 2023-12-21 ENCOUNTER — Other Ambulatory Visit (HOSPITAL_COMMUNITY): Payer: BC Managed Care – PPO

## 2023-12-28 ENCOUNTER — Ambulatory Visit: Payer: BC Managed Care – PPO | Admitting: Cardiology

## 2024-06-11 ENCOUNTER — Encounter: Payer: Self-pay | Admitting: Cardiology

## 2024-07-23 ENCOUNTER — Encounter: Payer: Self-pay | Admitting: Cardiology

## 2024-09-24 ENCOUNTER — Telehealth: Payer: Self-pay | Admitting: Cardiology

## 2024-09-24 DIAGNOSIS — R079 Chest pain, unspecified: Secondary | ICD-10-CM

## 2024-09-24 NOTE — Telephone Encounter (Signed)
 Called patient and pt calling to rescheduled ECHO. Order placed, and  appt made  to f/u appt to [facility] post ECHO. Pt verbalized understanding

## 2024-09-24 NOTE — Telephone Encounter (Signed)
 Patient wants to know if she needs an Echo before she makes an appt to come. Also said there was so other test the provider wanted her to do but she cannot remember what it was. Please advise

## 2024-09-25 DIAGNOSIS — H04123 Dry eye syndrome of bilateral lacrimal glands: Secondary | ICD-10-CM | POA: Diagnosis not present

## 2024-09-25 DIAGNOSIS — H40033 Anatomical narrow angle, bilateral: Secondary | ICD-10-CM | POA: Diagnosis not present

## 2024-10-16 ENCOUNTER — Other Ambulatory Visit: Payer: Self-pay | Admitting: Obstetrics and Gynecology

## 2024-10-16 DIAGNOSIS — E040 Nontoxic diffuse goiter: Secondary | ICD-10-CM

## 2024-10-16 DIAGNOSIS — Z01419 Encounter for gynecological examination (general) (routine) without abnormal findings: Secondary | ICD-10-CM | POA: Diagnosis not present

## 2024-10-24 ENCOUNTER — Ambulatory Visit: Payer: Self-pay | Admitting: Cardiology

## 2024-10-27 ENCOUNTER — Ambulatory Visit
Admission: RE | Admit: 2024-10-27 | Discharge: 2024-10-27 | Disposition: A | Discharge status: Home / Self Care | Payer: Self-pay | Source: Ambulatory Visit | Attending: Obstetrics and Gynecology | Admitting: Obstetrics and Gynecology

## 2024-10-27 DIAGNOSIS — E040 Nontoxic diffuse goiter: Secondary | ICD-10-CM

## 2024-10-27 DIAGNOSIS — E042 Nontoxic multinodular goiter: Secondary | ICD-10-CM | POA: Diagnosis not present

## 2024-11-03 NOTE — Telephone Encounter (Signed)
 Noted

## 2024-11-10 DIAGNOSIS — R232 Flushing: Secondary | ICD-10-CM | POA: Diagnosis not present

## 2024-11-10 DIAGNOSIS — Z13220 Encounter for screening for lipoid disorders: Secondary | ICD-10-CM | POA: Diagnosis not present

## 2024-11-10 DIAGNOSIS — N920 Excessive and frequent menstruation with regular cycle: Secondary | ICD-10-CM | POA: Diagnosis not present

## 2024-11-10 DIAGNOSIS — F4329 Adjustment disorder with other symptoms: Secondary | ICD-10-CM | POA: Diagnosis not present

## 2024-11-10 DIAGNOSIS — E041 Nontoxic single thyroid nodule: Secondary | ICD-10-CM | POA: Diagnosis not present

## 2024-11-10 DIAGNOSIS — Z0001 Encounter for general adult medical examination with abnormal findings: Secondary | ICD-10-CM | POA: Diagnosis not present

## 2024-11-12 NOTE — Patient Instructions (Signed)
 Thyroid Nodule  A thyroid nodule is an isolated growth of thyroid cells that forms a lump in the thyroid gland. The thyroid gland is a butterfly-shaped gland found in the lower front of the neck. It sends chemical messengers (hormones) through the blood to all parts of the body. These hormones are important in regulating body temperature and helping the body use energy. Thyroid nodules are common. Most are not cancerous (are benign). You may have one nodule or several nodules. There are different types of thyroid nodules. They include nodules that: Grow and fill with fluid (thyroid cysts). Produce too much thyroid hormone (hot nodules or hyperthyroid). Produce no thyroid hormone (cold nodules or hypothyroid). Form from cancer cells (thyroid cancers). What are the causes? In most cases, the cause of thyroid nodules is not known. What increases the risk? The following factors may make you more likely to develop thyroid nodules: Age. Thyroid nodules are more common in people who are older than 45 years. Female gender. A family history that includes: Thyroid nodules. Pheochromocytoma. Thyroid carcinoma. Hyperparathyroidism. Certain thyroid diseases, such as Hashimoto's thyroiditis. Lack of iodine in your diet. A history of head and neck radiation, such as from cancer treatments. Type 2 diabetes. What are the signs or symptoms? In many cases, there are no symptoms. If you have symptoms, they may include: A lump in your lower neck. Feeling pressure, fullness, or a tickle in your throat. Pain in your neck, jaw, or ear. Having trouble swallowing or breathing. Hot nodules may cause: Weight loss. Warm, flushed skin. Feeling hot. Feeling nervous. A rapid or irregular heartbeat. Cold nodules may cause: Weight gain. Dry skin. Hair loss, brittle hair, or both. Feeling cold. Fatigue. Thyroid cancer nodules may cause: Hard nodules that can be felt along the thyroid  gland. Hoarseness. Lumps in the tissue (lymph nodes) near your thyroid gland. How is this diagnosed? A thyroid nodule may be felt by your health care provider during a physical exam. This condition may also be diagnosed based on your symptoms. You may also have tests, including: Blood tests to check how well your thyroid is working. An ultrasound. This may be done to confirm the diagnosis. A biopsy. This involves taking a sample from the nodule and looking at it under a microscope. A thyroid scan. This test creates an image of the thyroid gland using a radioactive tracer. Imaging tests such as an MRI or CT scan. These may be done if: A nodule is large. A nodule is blocking your airway. Cancer is suspected. How is this treated? Treatment depends on the cause and size of your nodule or nodules. If a nodule is benign, treatment may not be necessary. Your health care provider may monitor the nodule to see if it goes away without treatment. If a nodule continues to grow, is cancerous, or does not go away, treatment may be needed. Treatment may include: Having a cystic nodule drained with a needle. Ablation therapy. In this treatment, alcohol is injected into the area of the nodule to destroy the cells. Ablation with heat may also be used. This is called thermal ablation. Radioactive iodine. In this treatment, radioactive iodine is given as a pill or liquid that you drink. This substance causes the thyroid nodule to shrink. Surgery to remove the nodule or nodules. Part or all of your thyroid gland may also need to be removed. Medicines to treat hyperthyroidism. Follow these instructions at home: Pay attention to any changes in your thyroid nodule or nodules. Take over-the-counter  and prescription medicines only as told by your health care provider. Keep all follow-up visits. This is important. Contact a health care provider if: You have trouble sleeping. You have muscle weakness. You have  significant weight loss without changing your eating habits. You feel nervous. You have trouble swallowing. You have increased swelling. You have a rapid or irregular heartbeat. Get help right away if: You have chest pain. You faint or lose consciousness. Your nodule makes it hard for you to breathe. These symptoms may be an emergency. Get help right away. Call 911. Do not wait to see if the symptoms will go away. Do not drive yourself to the hospital. Summary A thyroid nodule is an isolated growth of thyroid cells that forms a lump in your thyroid gland. Thyroid nodules are common. Most are not cancerous. Your health care provider may monitor the nodule to see if it goes away without treatment. If a nodule continues to grow, is cancerous, or does not go away, treatment may be needed. Treatment depends on the cause and size of your nodule or nodules. This information is not intended to replace advice given to you by your health care provider. Make sure you discuss any questions you have with your health care provider. Document Revised: 10/10/2021 Document Reviewed: 10/10/2021 Elsevier Patient Education  2024 [employer].

## 2024-11-13 ENCOUNTER — Encounter: Payer: Self-pay | Admitting: Nurse Practitioner

## 2024-11-13 ENCOUNTER — Ambulatory Visit: Payer: Self-pay | Admitting: Nurse Practitioner

## 2024-11-13 VITALS — BP 128/64 | HR 60 | Ht 68.0 in | Wt 157.4 lb

## 2024-11-13 DIAGNOSIS — E041 Nontoxic single thyroid nodule: Secondary | ICD-10-CM | POA: Diagnosis not present
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
## 2024-11-14 LAB — THYROID PEROXIDASE ANTIBODY: Thyroperoxidase Ab SerPl-aCnc: 19 [IU]/mL (ref 0–34)

## 2024-11-14 LAB — THYROTROPIN RECEPTOR AUTOABS: Thyrotropin Receptor Ab: 1.1 IU/L (ref 0.00–1.75)

## 2024-11-14 LAB — T3, FREE: T3, Free: 3.1 pg/mL (ref 2.0–4.4)

## 2024-11-14 LAB — TSH: TSH: 1.37 u[IU]/mL (ref 0.450–4.500)

## 2024-11-14 LAB — T4, FREE: Free T4: 1.14 ng/dL (ref 0.82–1.77)

## 2024-11-14 LAB — THYROGLOBULIN ANTIBODY: Thyroglobulin Antibody: <1 [IU]/mL (ref 0.0–0.9)

## 2024-11-17 ENCOUNTER — Ambulatory Visit: Payer: Self-pay | Admitting: Nurse Practitioner

## 2024-11-19 ENCOUNTER — Ambulatory Visit (HOSPITAL_COMMUNITY): Payer: Self-pay

## 2024-11-20 NOTE — Progress Notes (Deleted)
 Cardiology Office Note    Date:  11/20/2024  ID:  Marissa Stevenson, DOB [birth year], MRN [id number] PCP:  Patient, No Pcp Per  Cardiologist:  None  Electrophysiologist:  None   Chief Complaint: ***  History of Present Illness: .    Marissa Stevenson is a 38 y.o. female with visit-pertinent history of  chronic neck pain, postpartum hypertension and dizziness.    First seen by Dr. Sheena in 11/2022 for postpartum hypertension.  She was started on nifedipine , following her visit patient developed preeclampsia and delivered.  She was last seen by Dr. Sheena on 04/17/2023 and she had been tapered off of the antihypertensive with good blood pressure control.   On 11/12/2023 she notified our office of chest pain described as a dull throbbing ache that had started on 11/28.  At follow-up visit patient reported that she felt she had pulled a muscle in her left upper back have a pulling sensation across her chest when she turned to the head to the left.  It was questioned if this was musculoskeletal in nature.  CRP and sed rate were normal, echocardiogram ordered however was not completed at that time.    Precordial pain:  Postpartum hypertension: Blood pressure today   Labwork independently reviewed:   ROS: .   *** denies chest pain, shortness of breath, lower extremity edema, fatigue, palpitations, melena, hematuria, hemoptysis, diaphoresis, weakness, presyncope, syncope, orthopnea, and PND.  All other systems are reviewed and otherwise negative.  Studies Reviewed: SABRA    EKG:  EKG is ordered today, personally reviewed, demonstrating ***     CV Studies: Cardiac studies reviewed are outlined and summarized above. Otherwise please see EMR for full report. Cardiac Studies & Procedures   ______________________________________________________________________________________________     ECHOCARDIOGRAM  ECHOCARDIOGRAM COMPLETE 01/02/2023  Narrative ECHOCARDIOGRAM REPORT    Patient Name:   Marissa Stevenson Date of Exam: 01/01/2023 Medical Rec #:  [id number]       Height:       68.0 in Accession #:    [id number]      Weight:       156.8 lb Date of Birth:  July 03, 1986       BSA:          1.843 m Patient Age:    36 years        BP:           118/78 mmHg Patient Gender: F               HR:           75 bpm. Exam Location:  [address]  Procedure: 2D Echo, Cardiac Doppler and Color Doppler  Indications:    R06.02 SOB  History:        Patient has no prior history of Echocardiogram examinations. Signs/Symptoms:Shortness of Breath.  Sonographer:    Elsie Bohr RDCS Referring Phys: [id number] KARDIE TOBB  IMPRESSIONS   1. Left ventricular ejection fraction, by estimation, is 50 to 55%. The left ventricle has low normal function. The left ventricle has no regional wall motion abnormalities. Left ventricular diastolic parameters were normal. 2. Right ventricular systolic function is normal. The right ventricular size is normal. There is normal pulmonary artery systolic pressure. The estimated right ventricular systolic pressure is 18.1 mmHg. 3. The mitral valve is grossly normal. Trivial mitral valve regurgitation. No evidence of mitral stenosis. 4. The aortic valve is tricuspid. Aortic valve regurgitation is not visualized. No aortic stenosis is present. 5.  The inferior vena cava is normal in size with greater than 50% respiratory variability, suggesting right atrial pressure of 3 mmHg.  FINDINGS Left Ventricle: Left ventricular ejection fraction, by estimation, is 50 to 55%. The left ventricle has low normal function. The left ventricle has no regional wall motion abnormalities. The left ventricular internal cavity size was normal in size. There is no left ventricular hypertrophy. Left ventricular diastolic parameters were normal.  Right Ventricle: The right ventricular size is normal. No increase in right ventricular wall thickness. Right ventricular systolic function is normal. There  is normal pulmonary artery systolic pressure. The tricuspid regurgitant velocity is 1.94 m/s, and with an assumed right atrial pressure of 3 mmHg, the estimated right ventricular systolic pressure is 18.1 mmHg.  Left Atrium: Left atrial size was normal in size.  Right Atrium: Right atrial size was normal in size.  Pericardium: There is no evidence of pericardial effusion.  Mitral Valve: The mitral valve is grossly normal. Trivial mitral valve regurgitation. No evidence of mitral valve stenosis.  Tricuspid Valve: The tricuspid valve is grossly normal. Tricuspid valve regurgitation is trivial. No evidence of tricuspid stenosis.  Aortic Valve: The aortic valve is tricuspid. Aortic valve regurgitation is not visualized. No aortic stenosis is present.  Pulmonic Valve: The pulmonic valve was grossly normal. Pulmonic valve regurgitation is trivial. No evidence of pulmonic stenosis.  Aorta: The aortic root and ascending aorta are structurally normal, with no evidence of dilitation.  Venous: The inferior vena cava is normal in size with greater than 50% respiratory variability, suggesting right atrial pressure of 3 mmHg.  IAS/Shunts: The atrial septum is grossly normal.   LEFT VENTRICLE PLAX 2D LVIDd:         4.90 cm   Diastology LVIDs:         3.50 cm   LV e' medial:    8.27 cm/s LV PW:         1.00 cm   LV E/e' medial:  8.1 LV IVS:        1.10 cm   LV e' lateral:   9.03 cm/s LVOT diam:     2.10 cm   LV E/e' lateral: 7.4 LV SV:         67 LV SV Index:   36 LVOT Area:     3.46 cm   RIGHT VENTRICLE             IVC RV S prime:     20.40 cm/s  IVC diam: 1.30 cm TAPSE (M-mode): 2.0 cm RVSP:           18.1 mmHg  LEFT ATRIUM             Index        RIGHT ATRIUM           Index LA diam:        3.20 cm 1.74 cm/m   RA Pressure: 3.00 mmHg LA Vol (A2C):   36.5 ml 19.81 ml/m  RA Area:     12.30 cm LA Vol (A4C):   45.3 ml 24.58 ml/m  RA Volume:   28.00 ml  15.19 ml/m LA Biplane Vol:  40.6 ml 22.03 ml/m AORTIC VALVE LVOT Vmax:   97.40 cm/s LVOT Vmean:  66.300 cm/s LVOT VTI:    0.193 m  AORTA Ao Root diam: 3.60 cm Ao Asc diam:  3.00 cm  MITRAL VALVE               TRICUSPID VALVE  MV Area (PHT): 3.00 cm    TR Peak grad:   15.1 mmHg MV Decel Time: 253 msec    TR Vmax:        194.00 cm/s MV E velocity: 67.10 cm/s  Estimated RAP:  3.00 mmHg MV A velocity: 79.70 cm/s  RVSP:           18.1 mmHg MV E/A ratio:  0.84 SHUNTS Systemic VTI:  0.19 m Systemic Diam: 2.10 cm  Darryle Decent MD Electronically signed by Darryle Decent MD Signature Date/Time: 01/01/2023/7:23:54 PM    Final          ______________________________________________________________________________________________       Current Reported Medications:.    Active Medications[1]  Physical Exam:    VS:  There were no vitals taken for this visit.   Wt Readings from Last 3 Encounters:  11/13/24 157 lb 6.4 oz (71.4 kg)  11/16/23 156 lb (70.8 kg)  04/17/23 154 lb 3.2 oz (69.9 kg)    GEN: Well nourished, well developed in no acute distress NECK: No JVD; No carotid bruits CARDIAC: ***RRR, no murmurs, rubs, gallops RESPIRATORY:  Clear to auscultation without rales, wheezing or rhonchi  ABDOMEN: Soft, non-tender, non-distended EXTREMITIES:  No edema; No acute deformity     Asessement and Plan:.     ***     Disposition: F/u with ***  Signed, Brylee Mcgreal D Jaida Basurto, NP      [1]  No outpatient medications have been marked as taking for the 11/24/24 encounter (Appointment) with Jujhar Everett D, NP.

## 2024-11-24 ENCOUNTER — Ambulatory Visit: Payer: Self-pay | Attending: Cardiology | Admitting: Cardiology

## 2025-01-15 ENCOUNTER — Ambulatory Visit (HOSPITAL_COMMUNITY)
Admission: RE | Admit: 2025-01-15 | Discharge: 2025-01-15 | Disposition: A | Source: Ambulatory Visit | Attending: Cardiology | Admitting: Cardiology

## 2025-01-15 ENCOUNTER — Ambulatory Visit: Payer: Self-pay | Admitting: Cardiology

## 2025-01-15 DIAGNOSIS — R079 Chest pain, unspecified: Secondary | ICD-10-CM | POA: Diagnosis not present

## 2025-01-15 LAB — ECHOCARDIOGRAM COMPLETE
Area-P 1/2: 3.51 cm2
S' Lateral: 3.43 cm
